# Patient Record
Sex: Female | Born: 1975 | Race: Asian | Hispanic: No | Marital: Married | Smoking: Never smoker
Health system: Southern US, Community
[De-identification: ages and names within clinical notes are randomized; demographics above are authoritative.]

## PROBLEM LIST (undated history)

## (undated) DIAGNOSIS — R519 Headache, unspecified: Secondary | ICD-10-CM

## (undated) DIAGNOSIS — K429 Umbilical hernia without obstruction or gangrene: Secondary | ICD-10-CM

## (undated) DIAGNOSIS — J189 Pneumonia, unspecified organism: Secondary | ICD-10-CM

## (undated) DIAGNOSIS — O09529 Supervision of elderly multigravida, unspecified trimester: Secondary | ICD-10-CM

## (undated) HISTORY — DX: Headache, unspecified: R51.9

## (undated) HISTORY — DX: Pneumonia, unspecified organism: J18.9

## (undated) HISTORY — DX: Supervision of elderly multigravida, unspecified trimester: O09.529

---

## 2017-03-25 ENCOUNTER — Encounter (INDEPENDENT_AMBULATORY_CARE_PROVIDER_SITE_OTHER): Payer: Self-pay | Admitting: Obstetrics and Gynecology

## 2017-03-25 ENCOUNTER — Ambulatory Visit (INDEPENDENT_AMBULATORY_CARE_PROVIDER_SITE_OTHER): Payer: No Typology Code available for payment source | Admitting: Obstetrics and Gynecology

## 2017-03-25 VITALS — BP 117/76 | HR 91 | Ht 63.0 in | Wt 132.6 lb

## 2017-03-25 DIAGNOSIS — Z3201 Encounter for pregnancy test, result positive: Secondary | ICD-10-CM

## 2017-03-25 LAB — CBC
Absolute NRBC: 0 10*3/uL
Hematocrit: 38 % (ref 37.0–47.0)
Hgb: 12.5 g/dL (ref 12.0–16.0)
MCH: 29.9 pg (ref 28.0–32.0)
MCHC: 32.9 g/dL (ref 32.0–36.0)
MCV: 90.9 fL (ref 80.0–100.0)
MPV: 10.3 fL (ref 9.4–12.3)
Nucleated RBC: 0 /100 WBC (ref 0.0–1.0)
Platelets: 283 10*3/uL (ref 140–400)
RBC: 4.18 10*6/uL — ABNORMAL LOW (ref 4.20–5.40)
RDW: 14 % (ref 12–15)
WBC: 9.83 10*3/uL (ref 3.50–10.80)

## 2017-03-25 LAB — GONOCOCCUS CULTURE
Chlamydia trachomatis Culture: NEGATIVE
Culture Gonorrhoeae: NEGATIVE

## 2017-03-25 LAB — ANTIBODY SCREEN: AB Screen Gel: NEGATIVE

## 2017-03-25 LAB — HEMOGLOBIN A1C
Average Estimated Glucose: 102.5 mg/dL
Hemoglobin A1C: 5.2 % (ref 4.6–5.9)

## 2017-03-25 LAB — ABO/RH: ABO Rh: O POS

## 2017-03-25 LAB — RUBELLA ANTIBODY, IGG: Rubella AB, IgG: IMMUNE

## 2017-03-25 LAB — TSH: TSH: 0.82 u[IU]/mL (ref 0.35–4.94)

## 2017-03-25 NOTE — Progress Notes (Signed)
CC:  Here for confirmation of pregnancy    HPi:  41yo G1P0 Patient's last menstrual period was 12/17/2016 (exact date)., regular cycles, certain date.  Some pelvic pain, mild intensity, crampy.  No vaginal bleeding.  Denies nausea or vomiting    ROS:  Pt denies chest pain, migraine headaches or dizziness, cough or shortness of breath, breast lump/pain/tenderness, constipation, diarrhea, fever, rash, urinary incontinence or dysuria.   Denies joint pain, anxiety or depression.  Denies swollen lymph nodes.  No problems with dry eyes.      Some seasonal allergies (sore throat and nasal congestion)    History reviewed. No pertinent past medical history.      Current Outpatient Prescriptions:   .  Prenatal Vit-Fe Fumarate-FA (PRENATAL PO), Take by mouth., Disp: , Rfl:     No Known Allergies    Family History   Problem Relation Age of Onset   . Heart disease Father    . Hypertension Mother    . Breast cancer Neg Hx    . Ovarian cancer Neg Hx    . Colon cancer Neg Hx      BP 117/76   Pulse 91   Ht 5\' 3"  (1.6 m)   Wt 132 lb 9.6 oz (60.1 kg)   LMP 12/17/2016 (Exact Date)   BMI 23.49 kg/m    Gen:  INAD, well developed, well nourished  Psych:  AAO x 3, mood congruent with affect  Thyroid:  Nontender, no nodules, within normal limits  Lungs:  CTA bilat no wheezes, rales, rhonchi.  Normal respiratory effort.   CV:  RRR no murmurs/rubs/gallops  Skin:  No rash, no lesions  Abd:  S/Nondistended/no mass/nontender to palpation  Pelvic:  NEFG                 Limited ob sono done:  SIUP, active fetal movement, + fetal cardiac activity in 150s, normal appearing amniotic fluid, posterior placenta  Femur length = on average 1.37 cm = [redacted]w[redacted]d    A/SIUP @ 14w0 days by certain lmp and confirmed by today's femur length.     Pregnancy test positive confirmed     Genetic counseling    P/EDD= 09/23/17 by lmp and today's sono     Genetic Counseling done:      Aneuploidy risk of 1:150  discussed.  Options for invasive diagnostic testing                        versus screening for aneuploidy reviewed.               We discussed detection rate of Down syndrome with cffDNA of >99%, risks of               this test to include confounding test results should placental mosaicism or occult  maternal malignancy exist, as well as inability to run test if fetal fraction is low.   We discussed 20% risk of aneuploidy if fetal fraction is low and that invasive  testing likely to be recommended in that case.   Reviewed that fetal gender can              be determined with this test.  We also discussed that this test does not allow for  assessment of risk for neural tube defects in the fetus (spina bifida).         First trimester screen counseling done. We discussed its approximate detection  rate of Down syndrome  of about 85% and false positive rate of about 5%.  We  discussed that it also has the benefit of ruling out anenchephaly early in  pregnancy and potential to aid early detection of congenital heart defects.  We   discussed that a numerical risk assessment for the fetus having Down  syndrome, Trisomy 58, and Trisomy 13 will be provided.    We also discussed  time dependency of gestational age with the most optimal time for screening to  be performed at 10 weeks.  If the patient presents to the [facility] for the blood work portion of the test at [redacted] weeks EGA and returns for the  ultrasound portion of the test, the patient will be able to receive her numerical  risk assessment on completion of the ultrasound portion.      We discussed Quad screen testing at 15-22 weeks for spina bifida screening,  and risk assessment for Down Syndrome, with a detection rate of about 75%  and false positive rate of about 5%.              Pt voices understanding and desires aneuploidy screening in the form of cell free                    fetal DNA.        Global Carrier Screening for autosomal recessive disorder was performed.  We  reviewed that this tests screens  for the patient being a carrier of about 175  conditions.  We discussed that if patient is found to be a carrier for an autosomal  recessive disorder, then we recommend screening her partner, and if both are  found to be carriers for the same disorder, there is a 25% chance their offspring  could be affected with the condition.  We discussed that the chance the patient  will found to be a carrier of at least one condition is about 1 in 5, but the chance  that both her and her partner are carriers of the same condition is <1%. We  reviewed that the conditions being screened for are conditions that can  either  lead to a shortened lifespan, cause intellectual disability, whose  outcome may  be improved with early detection, or that has limited or no  treatment options.     Patient voices understanding and desires testing.         Counsyl cffDNA and carrier screening ordered.              CBC, ab screen, BTG, and screening tests for thyroid disease and hypothyroidism ordered                Follow up asap for IPV     Spent 30 minutes face to face with patient, >50% spent counseling and coordinating care.   Oriented patient to practice, rotating call schedule, schedule of visits, delivery and emergency care at [facility].

## 2017-03-25 NOTE — Patient Instructions (Signed)
Diet rich in vegetables and fruit discussed.  Limit increased caloric intake to an extra 300 kcal per day above your usual daily caloric intake.  Recommended weight gain for entire pregancy is 25-35 lbs with a 10 lb weight gain by 20 weeks of pregnancy. You will gain most of your weight at the end of pregnancy.  If you're body mass index (BMI) is >30, it is recommended you do not gain more than 15 lbs during the pregnancy.   Avoid unpasteurized foods.  Limit seafood intake to no greater than 2 servings per week avoiding fish high in Mercury (monkfish, tilefish, shark, king mackerel, swordfish).  Lunchmeat and hot dogs should be served hot.  Encourage at least 30 minutes of walking daily.  If you have been participating in sports/physical activity, you may continue at your usual level.  Stay well hydrated and avoid overheating.      Breastfeeding recommended through first year of the child's life and both short  term and long term benefits for their infant  to include reduction in gastrointestinal and respiratory disease as well as acute illness, reduction in maternal and childhood obesity, potential reduction in Type I diabetes and childhood cancers, and improved maternal-infant bonding, as well as  reduction in future risk of maternal breast cancer.    Recommended lactation support classes provided at [facility] prior to delivery and reference book of "The Nursing Mother's Companion" by Kathleen Huggins also recommended.

## 2017-03-27 ENCOUNTER — Encounter (INDEPENDENT_AMBULATORY_CARE_PROVIDER_SITE_OTHER): Payer: Self-pay | Admitting: Obstetrics and Gynecology

## 2017-03-28 ENCOUNTER — Encounter (INDEPENDENT_AMBULATORY_CARE_PROVIDER_SITE_OTHER): Payer: Self-pay | Admitting: Obstetrics and Gynecology

## 2017-04-02 ENCOUNTER — Encounter (INDEPENDENT_AMBULATORY_CARE_PROVIDER_SITE_OTHER): Payer: Self-pay | Admitting: Obstetrics and Gynecology

## 2017-04-02 ENCOUNTER — Encounter (INDEPENDENT_AMBULATORY_CARE_PROVIDER_SITE_OTHER): Payer: Self-pay | Admitting: Obstetrics & Gynecology

## 2017-04-02 ENCOUNTER — Ambulatory Visit (INDEPENDENT_AMBULATORY_CARE_PROVIDER_SITE_OTHER): Payer: No Typology Code available for payment source | Admitting: Obstetrics & Gynecology

## 2017-04-02 VITALS — BP 107/67 | HR 79 | Wt 132.6 lb

## 2017-04-02 DIAGNOSIS — Z3490 Encounter for supervision of normal pregnancy, unspecified, unspecified trimester: Secondary | ICD-10-CM | POA: Insufficient documentation

## 2017-04-02 DIAGNOSIS — O09512 Supervision of elderly primigravida, second trimester: Secondary | ICD-10-CM

## 2017-04-02 LAB — HEMOLYSIS INDEX: Hemolysis Index: 5 (ref 0–18)

## 2017-04-02 NOTE — Progress Notes (Signed)
Subjective:     Kim Booker,Kim Booker is being seen today for her first obstetrical visit.  She is at [redacted]w[redacted]d. Her obstetrical history is significant for: AMA.   Relationship with FOB: spouse, living together - Deliah Boston. Patient does intend to breast feed.   Denies nausea.  Denies cramping or bleeding.     ROS:  General: no unintentional weight changes, fevers, chills   Cardiovascular: negative  Pulmonary: negative  Gastrointestinal: negative  Genital: no lesions, abnormal vaginal discharge, pelvic pain, itching, dryness, irritation, abnormal vaginal bleeding  Urinary: no dysuria, hematuria, incontinence  Neurological: negative      Travel to [place] affected area in last 6 months for patient or partner: no  Cats at home: no   Personal or partner history of genital HSV: no      Menstrual History:  OB History     Gravida Para Term Preterm AB Living    1              SAB TAB Ectopic Multiple Live Births                        Patient's last menstrual period was 12/17/2016 (exact date).       Past Medical, Surgical, Family, Social history:     The following portions of the patient's history were reviewed and updated as appropriate: allergies, current medications, past family history, past medical history, past social history, past surgical history and problem list.         Objective:        Vitals:    04/02/17 1141   BP: 107/67   Pulse: 79     GEN: Well developed, alert and oriented x 3  SKIN: (exposed areas only)    Rashes: No  Lesions: No Ulcers: No  NECK:  supple, no significant adenopathy, thyroid is normal in size without nodules or tenderness  BREASTS: symmetric, normal axillary lymph nodes bilaterally   RIGHT: no masses, tenderness, skin changes, or nipple discharge   LEFT: no masses, tenderness, skin changes, or nipple discharge  LYMPHATIC: no significant cervical, supraclavicular, axillary or inguinal adenopathy  RESPIRATORY: lungs clear to auscultation bilaterally, no rales, rhonchi,  crackles  CARDIOVASCULAR: Regular rate, normal rhythm  ABDOMEN: Non-tender, no hernia or masses  GENITOURINARY EXAM:   EXT GENITALIA: normal external genitalia, no erythema, no lesions   PERINEUM: normal, no lesions, non tender   URETHRA / URETHRAL MEATUS: normal    VAGINA: normal appearing vagina with normal mucosa and discharge, no lesions, adequate support               CERVIX: normal appearing cervix without discharge or lesions   UTERUS: gravid, mobile, non-tender       Left ADNEXA: No masses, nodularity, tenderness   Right ADNEXA: No masses, nodularity, tenderness   RECTAL: deferred        Fetus-150bpm    Exam WNL, See Prenatal Physical.    Assessment:     40yo G1, IUP at [redacted]w[redacted]d by LMP c/w 14 [place] who presents for initial OB visit.    Pregnancy complicated by advanced maternal age  Plan:     Orders Placed This Encounter   Procedures   . Urine culture   . Pap Smear, Thin Prep Method w/ HR HPV   . RPR (Reflex to Titer and Confirmation)   . Rubella antibody, IgG   . Varicella Zoster Antibody, IgG   . HIV Ag/Ab 4th generation   .  Hepatitis C (HCV) antibody, Total   . Hepatitis B (HBV) Surface Antigen   . Hemoglobinopathy Profile   . Cystic Fibrosis Screen   . C. Trachomatis/N. Gonorrhoeae RNA, TMA   . CBC and differential   . Hemolysis index   . MFM US OB Detail Fetal Anatomy Single Or First Gestation         Oriented patient to practice and delivery and emergency care at [facility].  Discussed available screening for autosomal recessive diseases and chromosomal abnormalities (specifically trisomies 21, 13, 18), neural tube defects, and the level II sono.   Reviewed pregnancy education material given on diet, exercise, safe OCT meds, environmental exposures, recommended vaccinations and prenatal vitamins.    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    Return in about 4 weeks (around 04/30/2017) for ROB.

## 2017-04-03 LAB — CBC AND DIFFERENTIAL
Absolute NRBC: 0 10*3/uL
Basophils Absolute Automated: 0.05 10*3/uL (ref 0.00–0.20)
Basophils Automated: 0.6 %
Eosinophils Absolute Automated: 0.12 10*3/uL (ref 0.00–0.70)
Eosinophils Automated: 1.4 %
Hematocrit: 38.6 % (ref 37.0–47.0)
Hgb: 12.3 g/dL (ref 12.0–16.0)
Immature Granulocytes Absolute: 0.04 10*3/uL
Immature Granulocytes: 0.5 %
Lymphocytes Absolute Automated: 1.71 10*3/uL (ref 0.50–4.40)
Lymphocytes Automated: 19.3 %
MCH: 29.6 pg (ref 28.0–32.0)
MCHC: 31.9 g/dL — ABNORMAL LOW (ref 32.0–36.0)
MCV: 92.8 fL (ref 80.0–100.0)
MPV: 10.3 fL (ref 9.4–12.3)
Monocytes Absolute Automated: 0.62 10*3/uL (ref 0.00–1.20)
Monocytes: 7 %
Neutrophils Absolute: 6.3 10*3/uL (ref 1.80–8.10)
Neutrophils: 71.2 %
Nucleated RBC: 0 /100 WBC (ref 0.0–1.0)
Platelets: 279 10*3/uL (ref 140–400)
RBC: 4.16 10*6/uL — ABNORMAL LOW (ref 4.20–5.40)
RDW: 14 % (ref 12–15)
WBC: 8.84 10*3/uL (ref 3.50–10.80)

## 2017-04-03 LAB — RPR (REFLEX TO TITER AND CONFIRMATION): RPR: NONREACTIVE

## 2017-04-03 LAB — HEPATITIS C ANTIBODY: Hepatitis C, AB: NONREACTIVE

## 2017-04-03 LAB — RUBELLA ANTIBODY, IGG: Rubella AB, IgG: 2.76

## 2017-04-03 LAB — HIV AG/AB 4TH GENERATION: HIV Ag/Ab, 4th Generation: NONREACTIVE

## 2017-04-03 LAB — VARICELLA ZOSTER ANTIBODY, IGG: Varicella, IgG: 2.47

## 2017-04-03 LAB — HEPATITIS B SURFACE ANTIGEN W/ REFLEX TO CONFIRMATION: Hepatitis B Surface Antigen: NONREACTIVE

## 2017-04-04 ENCOUNTER — Encounter (INDEPENDENT_AMBULATORY_CARE_PROVIDER_SITE_OTHER): Payer: Self-pay | Admitting: Obstetrics and Gynecology

## 2017-04-04 LAB — HEMOGLOBINOPATHY PROFILE
Hemoglobin A2: 2.6 % (ref 1.8–3.5)
Hemoglobin A: 97.4 % (ref >96.0)
Hemoglobin F: 0 % (ref <2.0)
Plasma Free Hemoglobin: 1.3 mg/dL (ref <8.4)

## 2017-04-04 LAB — C. TRACHOMATIS/N. GONORRHOEAE RNA, TMA
Chlamydia trachomatis RNA, TMA: NOT DETECTED
N. gonorrhoeae RNA, TMA: NOT DETECTED

## 2017-04-08 ENCOUNTER — Telehealth (INDEPENDENT_AMBULATORY_CARE_PROVIDER_SITE_OTHER): Payer: Self-pay

## 2017-04-08 ENCOUNTER — Encounter (INDEPENDENT_AMBULATORY_CARE_PROVIDER_SITE_OTHER): Payer: Self-pay

## 2017-04-08 LAB — CYSTIC FIBROSIS SCREEN

## 2017-04-08 NOTE — Telephone Encounter (Signed)
Per Dr. 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 Please recommend she and her partner set up phone genetics counseling with [facility] and consider testing for her partner.     Called patient and was unable to leave a VM message, as voice mail has not been set up yet.  Will send MyChart message for patient to call the office to review [facility] carrier screening results and recommendations.

## 2017-04-09 ENCOUNTER — Encounter (INDEPENDENT_AMBULATORY_CARE_PROVIDER_SITE_OTHER): Payer: Self-pay

## 2017-04-09 ENCOUNTER — Telehealth (INDEPENDENT_AMBULATORY_CARE_PROVIDER_SITE_OTHER): Payer: Self-pay

## 2017-04-09 LAB — PAP SMEAR, THIN PREP WITH HR HPV: HPV DNA, high risk: NOT DETECTED

## 2017-04-09 NOTE — Telephone Encounter (Signed)
If pt call back asking about her baby gender, please inform her she is having a GIRL!

## 2017-04-09 NOTE — Telephone Encounter (Signed)
Patient returned my call to the office.  I spoke with the patient and her husband Casimiro Needle.  Patient's spouse had carrier testing done on 04/02/17.

## 2017-04-12 ENCOUNTER — Encounter (INDEPENDENT_AMBULATORY_CARE_PROVIDER_SITE_OTHER): Payer: Self-pay | Admitting: Obstetrics & Gynecology

## 2017-04-16 ENCOUNTER — Encounter (INDEPENDENT_AMBULATORY_CARE_PROVIDER_SITE_OTHER): Payer: Self-pay | Admitting: Obstetrics and Gynecology

## 2017-04-16 ENCOUNTER — Encounter (INDEPENDENT_AMBULATORY_CARE_PROVIDER_SITE_OTHER): Payer: Self-pay | Admitting: Obstetrics & Gynecology

## 2017-04-26 ENCOUNTER — Encounter (INDEPENDENT_AMBULATORY_CARE_PROVIDER_SITE_OTHER): Payer: Self-pay | Admitting: Obstetrics and Gynecology

## 2017-04-30 ENCOUNTER — Ambulatory Visit (INDEPENDENT_AMBULATORY_CARE_PROVIDER_SITE_OTHER): Payer: No Typology Code available for payment source | Admitting: Obstetrics and Gynecology

## 2017-04-30 VITALS — BP 111/72 | HR 73 | Wt 136.2 lb

## 2017-04-30 DIAGNOSIS — O09512 Supervision of elderly primigravida, second trimester: Secondary | ICD-10-CM

## 2017-04-30 DIAGNOSIS — Z3A19 19 weeks gestation of pregnancy: Secondary | ICD-10-CM

## 2017-04-30 NOTE — Progress Notes (Signed)
G1 P0, 19 weeks 1 day.  Complains only of some lower back discomfort, mild.    Back exercises literature reviewed.  Chiropractic care not recommended at this time.    Size equals dates.    Msafp counseling done and ordered today  Prenatal labs reviewed.  Cell free fetal DNA within normal limits and shared with patient she is having a female fetus  Follow-up in 4 weeks.  Complete OB sono at 20 weeks, scheduled

## 2017-05-04 LAB — ALPHA FETOPROTEIN, MATERNAL
Gestational Age:: 19.1
MSAFP MoM: 1.18
MSAFP: 64.6 ng/mL
Number of Fetuses:: 1
Repeat Specimen:: NEGATIVE
Risk for ONTD: 1:2300 {titer}
WGHT: 136

## 2017-05-07 ENCOUNTER — Ambulatory Visit
Admission: RE | Admit: 2017-05-07 | Discharge: 2017-05-07 | Disposition: A | Discharge status: Home / Self Care | Payer: No Typology Code available for payment source | Source: Ambulatory Visit | Attending: Obstetrics & Gynecology | Admitting: Obstetrics & Gynecology

## 2017-05-07 DIAGNOSIS — O26892 Other specified pregnancy related conditions, second trimester: Secondary | ICD-10-CM

## 2017-05-07 DIAGNOSIS — Z3A20 20 weeks gestation of pregnancy: Secondary | ICD-10-CM | POA: Insufficient documentation

## 2017-05-07 DIAGNOSIS — O321XX0 Maternal care for breech presentation, not applicable or unspecified: Secondary | ICD-10-CM

## 2017-05-07 DIAGNOSIS — O09512 Supervision of elderly primigravida, second trimester: Secondary | ICD-10-CM | POA: Insufficient documentation

## 2017-05-07 DIAGNOSIS — O09513 Supervision of elderly primigravida, third trimester: Secondary | ICD-10-CM

## 2017-05-07 DIAGNOSIS — O359XX0 Maternal care for (suspected) fetal abnormality and damage, unspecified, not applicable or unspecified: Secondary | ICD-10-CM

## 2017-05-28 ENCOUNTER — Ambulatory Visit (INDEPENDENT_AMBULATORY_CARE_PROVIDER_SITE_OTHER): Payer: No Typology Code available for payment source | Admitting: Obstetrics & Gynecology

## 2017-05-31 ENCOUNTER — Ambulatory Visit (INDEPENDENT_AMBULATORY_CARE_PROVIDER_SITE_OTHER): Payer: No Typology Code available for payment source | Admitting: Obstetrics and Gynecology

## 2017-05-31 ENCOUNTER — Encounter (INDEPENDENT_AMBULATORY_CARE_PROVIDER_SITE_OTHER): Payer: Self-pay | Admitting: Obstetrics and Gynecology

## 2017-05-31 VITALS — BP 104/69 | HR 86 | Wt 141.0 lb

## 2017-05-31 DIAGNOSIS — Z3A23 23 weeks gestation of pregnancy: Secondary | ICD-10-CM

## 2017-05-31 DIAGNOSIS — O09523 Supervision of elderly multigravida, third trimester: Secondary | ICD-10-CM

## 2017-05-31 NOTE — Patient Instructions (Signed)
Call for contractions > 4-6 times in an hour that are not relieved after resting and drinking water for an hour.          Call if vaginal bleeding occurs, or if you think your water broke.    Call for severe headaches, flashing lights in your vision, upper abdominal pain, nausea or vomiting.      Follow up for your next routine ob visit in 4 week(s), 28 week labs next visit

## 2017-05-31 NOTE — Progress Notes (Signed)
41yo G1P0, [redacted]w[redacted]d  Intermittent headaches, some constipation.  PO hydration encouraged, measures for relief of constipation reviewed  Reassurance provided about trial for vaginal delivery, pt wondered if a primary cesarean section is indicated based on her maternal age.   Answered questions about orthodonture in pregnancy  Sleep position discussed, leftward pelvic tilt advised  Reviewed normal MSAFP results and normal complete ob sono  PTL precautions reviewed, f/u in 4 weeks, 28 week labs next visit

## 2017-06-24 ENCOUNTER — Ambulatory Visit (INDEPENDENT_AMBULATORY_CARE_PROVIDER_SITE_OTHER): Payer: No Typology Code available for payment source | Admitting: Obstetrics and Gynecology

## 2017-07-05 ENCOUNTER — Ambulatory Visit (INDEPENDENT_AMBULATORY_CARE_PROVIDER_SITE_OTHER): Payer: No Typology Code available for payment source | Admitting: Obstetrics and Gynecology

## 2017-07-05 ENCOUNTER — Other Ambulatory Visit (FREE_STANDING_LABORATORY_FACILITY): Payer: No Typology Code available for payment source

## 2017-07-05 ENCOUNTER — Encounter (INDEPENDENT_AMBULATORY_CARE_PROVIDER_SITE_OTHER): Payer: Self-pay | Admitting: Obstetrics and Gynecology

## 2017-07-05 VITALS — BP 108/75 | HR 84 | Wt 145.8 lb

## 2017-07-05 DIAGNOSIS — O09523 Supervision of elderly multigravida, third trimester: Secondary | ICD-10-CM

## 2017-07-05 DIAGNOSIS — Z3A23 23 weeks gestation of pregnancy: Secondary | ICD-10-CM

## 2017-07-05 DIAGNOSIS — Z23 Encounter for immunization: Secondary | ICD-10-CM

## 2017-07-05 DIAGNOSIS — Z3403 Encounter for supervision of normal first pregnancy, third trimester: Secondary | ICD-10-CM

## 2017-07-05 DIAGNOSIS — O09513 Supervision of elderly primigravida, third trimester: Secondary | ICD-10-CM

## 2017-07-05 DIAGNOSIS — Z3A28 28 weeks gestation of pregnancy: Secondary | ICD-10-CM

## 2017-07-05 LAB — CBC
Absolute NRBC: 0 10*3/uL
Hematocrit: 33.1 % — ABNORMAL LOW (ref 37.0–47.0)
Hgb: 10.9 g/dL — ABNORMAL LOW (ref 12.0–16.0)
MCH: 31 pg (ref 28.0–32.0)
MCHC: 32.9 g/dL (ref 32.0–36.0)
MCV: 94 fL (ref 80.0–100.0)
MPV: 10.4 fL (ref 9.4–12.3)
Nucleated RBC: 0 /100 WBC (ref 0.0–1.0)
Platelets: 255 10*3/uL (ref 140–400)
RBC: 3.52 10*6/uL — ABNORMAL LOW (ref 4.20–5.40)
RDW: 13 % (ref 12–15)
WBC: 10.23 10*3/uL (ref 3.50–10.80)

## 2017-07-05 LAB — ABO/RH: ABO Rh: O POS

## 2017-07-05 LAB — GLUCOSE CHALLENGE: Glucose Challenge: 135 mg/dL

## 2017-07-05 LAB — ANTIBODY SCREEN: AB Screen Gel: NEGATIVE

## 2017-07-05 NOTE — Progress Notes (Signed)
41yo G1P0, [redacted]w[redacted]d  No complaints, +AFM.  No abnormal discharge per vagina, no abnormal bleeding  No contractions  Reviewed weight gain, TWG anticipated to be within recommended guidelines  S=D  28 week labs today  PTL/FKC precautions reviewed  TdaP counseling performed, ordered today.   F/U in 2 weeks

## 2017-07-05 NOTE — Patient Instructions (Signed)
Call for contractions > 4-6 times in an hour that are not relieved after resting and drinking water for an hour.      Call if unable to get adequate fetal kick counts:  Pick the time of the day when your baby is the most active.  You should be able to get 10 movements in that hour.  Any movement counts (kick, punch, roll).  The baby will not be that active all day long only during their usual time of increased activity.  If unable to get your fetal kick counts, drink cold water or something sweet and repeat.  If still unable to get your kick counts, call our office even if it is after normal business hours.      Call if vaginal bleeding occurs, or if you think your water broke.    Call for severe headaches, flashing lights in your vision, upper abdominal pain, nausea or vomiting.      Follow up for your next routine ob visit in 2 week(s)

## 2017-07-06 ENCOUNTER — Other Ambulatory Visit (INDEPENDENT_AMBULATORY_CARE_PROVIDER_SITE_OTHER): Payer: Self-pay | Admitting: Obstetrics and Gynecology

## 2017-07-06 DIAGNOSIS — O99810 Abnormal glucose complicating pregnancy: Secondary | ICD-10-CM

## 2017-07-11 ENCOUNTER — Ambulatory Visit
Admission: RE | Admit: 2017-07-11 | Discharge: 2017-07-11 | Disposition: A | Discharge status: Home / Self Care | Payer: No Typology Code available for payment source | Source: Ambulatory Visit | Attending: Obstetrics and Gynecology | Admitting: Obstetrics and Gynecology

## 2017-07-11 DIAGNOSIS — O09513 Supervision of elderly primigravida, third trimester: Secondary | ICD-10-CM | POA: Insufficient documentation

## 2017-07-11 DIAGNOSIS — O26893 Other specified pregnancy related conditions, third trimester: Secondary | ICD-10-CM

## 2017-07-11 DIAGNOSIS — Z3A29 29 weeks gestation of pregnancy: Secondary | ICD-10-CM

## 2017-07-11 DIAGNOSIS — Z3A28 28 weeks gestation of pregnancy: Secondary | ICD-10-CM

## 2017-07-11 DIAGNOSIS — O322XX0 Maternal care for transverse and oblique lie, not applicable or unspecified: Secondary | ICD-10-CM

## 2017-07-11 DIAGNOSIS — O99810 Abnormal glucose complicating pregnancy: Secondary | ICD-10-CM | POA: Insufficient documentation

## 2017-07-12 ENCOUNTER — Other Ambulatory Visit (FREE_STANDING_LABORATORY_FACILITY): Payer: No Typology Code available for payment source

## 2017-07-12 DIAGNOSIS — O99810 Abnormal glucose complicating pregnancy: Secondary | ICD-10-CM

## 2017-07-12 LAB — GLUCOSE TOLERANCE, 3 HOURS
GTT - 1 hr Specimen: 152 mg/dL
GTT - 2 hr Specimen: 146 mg/dL
GTT - 3 hr Specimen: 108 mg/dL
GTT - Fasting Specimen: 68 mg/dL — ABNORMAL LOW (ref 70–100)

## 2017-07-16 ENCOUNTER — Encounter (INDEPENDENT_AMBULATORY_CARE_PROVIDER_SITE_OTHER): Payer: Self-pay | Admitting: Obstetrics and Gynecology

## 2017-07-16 ENCOUNTER — Ambulatory Visit (INDEPENDENT_AMBULATORY_CARE_PROVIDER_SITE_OTHER): Payer: No Typology Code available for payment source | Admitting: Obstetrics and Gynecology

## 2017-07-16 VITALS — BP 113/74 | HR 90 | Wt 148.6 lb

## 2017-07-16 DIAGNOSIS — Z3A30 30 weeks gestation of pregnancy: Secondary | ICD-10-CM

## 2017-07-16 DIAGNOSIS — O09513 Supervision of elderly primigravida, third trimester: Secondary | ICD-10-CM

## 2017-07-16 NOTE — Patient Instructions (Signed)
Call for contractions > 4-6 times in an hour that are not relieved after resting and drinking water for an hour.      Call if unable to get adequate fetal kick counts:  Pick the time of the day when your baby is the most active.  You should be able to get 10 movements in that hour.  Any movement counts (kick, punch, roll).  The baby will not be that active all day long only during their usual time of increased activity.  If unable to get your fetal kick counts, drink cold water or something sweet and repeat.  If still unable to get your kick counts, call our office even if it is after normal business hours.      Call if vaginal bleeding occurs, or if you think your water broke.    Call for severe headaches, flashing lights in your vision, upper abdominal pain, nausea or vomiting.      Follow up for your next routine ob visit in 2 week(s)

## 2017-07-16 NOTE — Progress Notes (Signed)
41yo G1P0, [redacted]w[redacted]d  No complaints  +AFM.  No contractions.  Passed 3 hr GTT  S=D  PTL/FKC precautions reviewed  Answered questions regarding expectations of L&D, pediatrician/FP referral  F/U 2 weeks

## 2017-07-31 ENCOUNTER — Ambulatory Visit (INDEPENDENT_AMBULATORY_CARE_PROVIDER_SITE_OTHER): Payer: Self-pay | Admitting: Obstetrics and Gynecology

## 2017-08-01 ENCOUNTER — Encounter (INDEPENDENT_AMBULATORY_CARE_PROVIDER_SITE_OTHER): Payer: Self-pay | Admitting: Obstetrics & Gynecology

## 2017-08-01 ENCOUNTER — Ambulatory Visit (INDEPENDENT_AMBULATORY_CARE_PROVIDER_SITE_OTHER): Payer: No Typology Code available for payment source | Admitting: Obstetrics & Gynecology

## 2017-08-01 VITALS — BP 100/66 | HR 99 | Wt 150.2 lb

## 2017-08-01 DIAGNOSIS — O09513 Supervision of elderly primigravida, third trimester: Secondary | ICD-10-CM

## 2017-08-02 NOTE — Progress Notes (Signed)
IUP at 32.3 weeks, no complaints except edema of hands and feet  Discussed increase water and decrease salt  Good FM. Denies cramping or bleeding or ctxs  S=D, received TDAP  F/u in 2 weeks

## 2017-08-06 ENCOUNTER — Ambulatory Visit: Payer: Self-pay

## 2017-08-12 ENCOUNTER — Ambulatory Visit (INDEPENDENT_AMBULATORY_CARE_PROVIDER_SITE_OTHER): Payer: No Typology Code available for payment source | Admitting: Obstetrics and Gynecology

## 2017-08-12 ENCOUNTER — Encounter (INDEPENDENT_AMBULATORY_CARE_PROVIDER_SITE_OTHER): Payer: Self-pay | Admitting: Obstetrics and Gynecology

## 2017-08-12 VITALS — BP 99/62 | HR 96 | Wt 149.6 lb

## 2017-08-12 DIAGNOSIS — Z3A34 34 weeks gestation of pregnancy: Secondary | ICD-10-CM

## 2017-08-12 DIAGNOSIS — Z23 Encounter for immunization: Secondary | ICD-10-CM

## 2017-08-12 DIAGNOSIS — Z3403 Encounter for supervision of normal first pregnancy, third trimester: Secondary | ICD-10-CM

## 2017-08-12 NOTE — Patient Instructions (Signed)
Call for contractions > 4-6 times in an hour that are not relieved after resting and drinking water for an hour.      Call if unable to get adequate fetal kick counts:  Pick the time of the day when your baby is the most active.  You should be able to get 10 movements in that hour.  Any movement counts (kick, punch, roll).  The baby will not be that active all day long only during their usual time of increased activity.  If unable to get your fetal kick counts, drink cold water or something sweet and repeat.  If still unable to get your kick counts, call our office even if it is after normal business hours.      Call if vaginal bleeding occurs, or if you think your water broke.    Call for severe headaches, flashing lights in your vision, upper abdominal pain, nausea or vomiting.      Follow up for your next routine ob visit in 2 week(s)    You receive your flu vaccine today

## 2017-08-12 NOTE — Progress Notes (Signed)
Pt complains of bilateral hand numbness in the morning consistent with carpal tunnel syndrome, advised to use wrist splints at night  +AFM via [facility]  No leaking of fluid, abnormal discharge or bleeding per vagina  No contractions  S=D    PTL/FKC/Preeclampsia precautions  F/U in 2 weeks    TdaP: has been given already  Flu vaccine counseling done and given today  Labs: GBS and sono next visit

## 2017-08-19 LAB — GROUP B STREP TRANSCRIBED: GBS Transcribed: NEGATIVE

## 2017-08-22 ENCOUNTER — Telehealth: Payer: Self-pay

## 2017-08-22 NOTE — Telephone Encounter (Signed)
Voice message left for pt to return call to complete pre-admission questions.

## 2017-08-28 ENCOUNTER — Ambulatory Visit (INDEPENDENT_AMBULATORY_CARE_PROVIDER_SITE_OTHER): Payer: No Typology Code available for payment source | Admitting: Obstetrics & Gynecology

## 2017-08-28 ENCOUNTER — Encounter (INDEPENDENT_AMBULATORY_CARE_PROVIDER_SITE_OTHER): Payer: Self-pay | Admitting: Obstetrics & Gynecology

## 2017-08-28 VITALS — BP 107/76 | HR 99 | Wt 154.6 lb

## 2017-08-28 DIAGNOSIS — Z3403 Encounter for supervision of normal first pregnancy, third trimester: Secondary | ICD-10-CM

## 2017-08-28 NOTE — Progress Notes (Signed)
[redacted]w[redacted]d  Reports good fetal movement.  Denies contractions, vaginal bleeding, or leaking fluid.   Reviewed preeclampsia, labor and FKC precautions.   All questions answered.   GBS done today.  Cephalic presentation confirmed by ultrasound today.    Patient needs NST done today.  2.  Late for NST be performed today.  Patient will return to office tomorrow morning at 8:15 to have NST done.    Note, patient states that she would like to deliver at the [facility].  Patient states that she has met with a midwife at [facility].  She states she has not transferred care yet.  Patient advised that if she would like to deliver at the natural birth center with a midwife care, then she would need to transfer her obstetrical care.

## 2017-08-29 ENCOUNTER — Ambulatory Visit (INDEPENDENT_AMBULATORY_CARE_PROVIDER_SITE_OTHER): Payer: No Typology Code available for payment source | Admitting: Obstetrics & Gynecology

## 2017-08-29 ENCOUNTER — Encounter (INDEPENDENT_AMBULATORY_CARE_PROVIDER_SITE_OTHER): Payer: Self-pay | Admitting: Obstetrics & Gynecology

## 2017-08-29 VITALS — BP 99/68 | HR 79 | Wt 151.8 lb

## 2017-08-29 DIAGNOSIS — O09513 Supervision of elderly primigravida, third trimester: Secondary | ICD-10-CM

## 2017-08-29 NOTE — Progress Notes (Signed)
Patient returns today for NST. No complaints. Good fetal movement. NST reactive today.

## 2017-09-02 ENCOUNTER — Encounter (INDEPENDENT_AMBULATORY_CARE_PROVIDER_SITE_OTHER): Payer: Self-pay | Admitting: Obstetrics & Gynecology

## 2017-09-02 ENCOUNTER — Telehealth: Payer: Self-pay

## 2017-09-02 NOTE — Telephone Encounter (Signed)
Will call at a later time to review preadmission question as per pt's request.

## 2017-09-03 ENCOUNTER — Telehealth: Payer: Self-pay

## 2017-09-03 NOTE — Telephone Encounter (Signed)
Voice message left requesting a return call to review pre-admissions questions.

## 2017-09-05 ENCOUNTER — Ambulatory Visit (INDEPENDENT_AMBULATORY_CARE_PROVIDER_SITE_OTHER): Payer: Self-pay | Admitting: Obstetrics & Gynecology

## 2017-09-12 ENCOUNTER — Ambulatory Visit (INDEPENDENT_AMBULATORY_CARE_PROVIDER_SITE_OTHER): Payer: No Typology Code available for payment source | Admitting: Obstetrics & Gynecology

## 2017-09-18 ENCOUNTER — Ambulatory Visit (INDEPENDENT_AMBULATORY_CARE_PROVIDER_SITE_OTHER): Payer: No Typology Code available for payment source | Admitting: Obstetrics & Gynecology

## 2017-09-23 ENCOUNTER — Ambulatory Visit (INDEPENDENT_AMBULATORY_CARE_PROVIDER_SITE_OTHER): Payer: No Typology Code available for payment source | Admitting: Obstetrics & Gynecology

## 2017-09-25 ENCOUNTER — Other Ambulatory Visit: Payer: Self-pay | Admitting: Advanced Practice Midwife

## 2017-09-25 DIAGNOSIS — O480 Post-term pregnancy: Secondary | ICD-10-CM

## 2017-09-27 ENCOUNTER — Ambulatory Visit: Payer: No Typology Code available for payment source

## 2017-09-29 ENCOUNTER — Inpatient Hospital Stay
Admission: RE | Admit: 2017-09-29 | Discharge: 2017-10-03 | DRG: 788 | Disposition: A | Discharge status: Home / Self Care | Payer: No Typology Code available for payment source | Source: Ambulatory Visit | Attending: Obstetrics & Gynecology | Admitting: Obstetrics & Gynecology

## 2017-09-29 DIAGNOSIS — O480 Post-term pregnancy: Secondary | ICD-10-CM | POA: Diagnosis present

## 2017-09-29 DIAGNOSIS — R339 Retention of urine, unspecified: Secondary | ICD-10-CM | POA: Diagnosis not present

## 2017-09-29 DIAGNOSIS — O4210 Premature rupture of membranes, onset of labor more than 24 hours following rupture, unspecified weeks of gestation: Principal | ICD-10-CM | POA: Diagnosis present

## 2017-09-29 DIAGNOSIS — O9952 Diseases of the respiratory system complicating childbirth: Secondary | ICD-10-CM | POA: Diagnosis present

## 2017-09-29 DIAGNOSIS — Z3A40 40 weeks gestation of pregnancy: Secondary | ICD-10-CM

## 2017-09-29 DIAGNOSIS — J45909 Unspecified asthma, uncomplicated: Secondary | ICD-10-CM | POA: Diagnosis present

## 2017-09-29 LAB — CBC AND DIFFERENTIAL
Absolute NRBC: 0 10*3/uL
Basophils Absolute Automated: 0.03 10*3/uL (ref 0.00–0.20)
Basophils Automated: 0.4 %
Eosinophils Absolute Automated: 0.06 10*3/uL (ref 0.00–0.70)
Eosinophils Automated: 0.8 %
Hematocrit: 35.2 % — ABNORMAL LOW (ref 37.0–47.0)
Hgb: 12 g/dL (ref 12.0–16.0)
Immature Granulocytes Absolute: 0.07 10*3/uL — ABNORMAL HIGH
Immature Granulocytes: 1 %
Lymphocytes Absolute Automated: 1.85 10*3/uL (ref 0.50–4.40)
Lymphocytes Automated: 26 %
MCH: 30.8 pg (ref 28.0–32.0)
MCHC: 34.1 g/dL (ref 32.0–36.0)
MCV: 90.5 fL (ref 80.0–100.0)
MPV: 11.5 fL (ref 9.4–12.3)
Monocytes Absolute Automated: 0.78 10*3/uL (ref 0.00–1.20)
Monocytes: 11 %
Neutrophils Absolute: 4.32 10*3/uL (ref 1.80–8.10)
Neutrophils: 60.8 %
Nucleated RBC: 0 /100 WBC (ref 0.0–1.0)
Platelets: 178 10*3/uL (ref 140–400)
RBC: 3.89 10*6/uL — ABNORMAL LOW (ref 4.20–5.40)
RDW: 15 % (ref 12–15)
WBC: 7.11 10*3/uL (ref 3.50–10.80)

## 2017-09-29 LAB — TYPE AND SCREEN
AB Screen Gel: NEGATIVE
ABO Rh: O POS

## 2017-09-29 MED ORDER — SODIUM CHLORIDE 0.9 % IJ SOLN
3.0000 mL | Freq: Three times a day (TID) | INTRAMUSCULAR | Status: DC
Start: 2017-09-29 — End: 2017-09-30
  Administered 2017-09-30: 16:00:00 3 mL via INTRAVENOUS

## 2017-09-29 MED ORDER — FENTANYL CITRATE (PF) 50 MCG/ML IJ SOLN (WRAP)
100.0000 ug | INTRAMUSCULAR | Status: DC | PRN
Start: 2017-09-29 — End: 2017-09-30
  Administered 2017-09-30 (×2): 50 ug via INTRAVENOUS
  Filled 2017-09-29: qty 2

## 2017-09-29 MED ORDER — TERBUTALINE SULFATE 1 MG/ML IJ SOLN
0.2500 mg | Freq: Once | INTRAMUSCULAR | Status: DC | PRN
Start: 2017-09-29 — End: 2017-09-30

## 2017-09-29 MED ORDER — ACETAMINOPHEN 650 MG RE SUPP
650.0000 mg | Freq: Four times a day (QID) | RECTAL | Status: DC | PRN
Start: 2017-09-29 — End: 2017-09-30

## 2017-09-29 MED ORDER — PROMETHAZINE HCL 25 MG RE SUPP
12.5000 mg | Freq: Four times a day (QID) | RECTAL | Status: DC | PRN
Start: 2017-09-29 — End: 2017-09-30

## 2017-09-29 MED ORDER — PROMETHAZINE HCL 12.5 MG PO TABS
12.5000 mg | ORAL_TABLET | Freq: Four times a day (QID) | ORAL | Status: DC | PRN
Start: 2017-09-29 — End: 2017-09-30

## 2017-09-29 MED ORDER — MISOPROSTOL 100 MCG PO TABS
50.0000 ug | ORAL_TABLET | Freq: Four times a day (QID) | ORAL | Status: AC
Start: 2017-09-29 — End: 2017-09-30
  Administered 2017-09-29 – 2017-09-30 (×3): 50 ug via ORAL
  Filled 2017-09-29: qty 3

## 2017-09-29 MED ORDER — ONDANSETRON 4 MG PO TBDP
4.0000 mg | ORAL_TABLET | Freq: Three times a day (TID) | ORAL | Status: DC | PRN
Start: 2017-09-29 — End: 2017-09-30

## 2017-09-29 MED ORDER — ONDANSETRON HCL 4 MG/2ML IJ SOLN
4.0000 mg | Freq: Three times a day (TID) | INTRAMUSCULAR | Status: DC | PRN
Start: 2017-09-29 — End: 2017-09-30

## 2017-09-29 MED ORDER — ACETAMINOPHEN 325 MG PO TABS
650.0000 mg | ORAL_TABLET | Freq: Four times a day (QID) | ORAL | Status: DC | PRN
Start: 2017-09-29 — End: 2017-09-30

## 2017-09-29 MED ORDER — PROMETHAZINE HCL 25 MG/ML IJ SOLN
6.2500 mg | Freq: Four times a day (QID) | INTRAMUSCULAR | Status: DC | PRN
Start: 2017-09-29 — End: 2017-09-30

## 2017-09-29 NOTE — H&P (Signed)
Subjective:   Kim Booker is a 41 y.o. G1 P0 female with EDC 09/21/2017 at 40 and 6/[redacted] weeks gestation who is being admitted for PROM, labor augmentation.  Her current obstetrical history is significant for advanced maternal age.  Patient reports leaking of clear fluid since 0120 this morning.   Fetal Movement: normal.   Past Medical History:   Diagnosis Date   . AMA (advanced maternal age) multigravida 35+    . Asthma    .        Objective:     Vital signs in last 24 hours:         General:   alert, appears stated age and cooperative   Lungs:   clear to auscultation bilaterally   Heart:   regular rate and rhythm, S1, S2 normal, no murmur, click, rub or gallop   Abdomen:  soft, non-tender; bowel sounds normal; no masses,  no organomegaly and gravid, s=d   Pelvis:  adequate   FHT:  145 BPM   Presentations: cephalic   Cervix:     Dilation: Closed    Effacement: Long    Station:  Floating    Consistency: firm    Position: posterior     Lab Review  See prenatal     Assessment/Plan:   40 and 6/[redacted] weeks gestation.  Not in labor. and PROM  Obstetrical history significant for advanced maternal age.       Risks, benefits, alternatives and possible complications have been discussed in detail with the patient.  Pre-admission, admission, and post admission procedures and expectations were discussed in detail.  All questions answered, all appropriate consents will be signed at the Hospital. Admission is planned for today.   Augmentation: PO Cytotec.

## 2017-09-29 NOTE — Plan of Care (Signed)
Problem: Pain  Goal: Pain at adequate level as identified by patient  Outcome: Progressing  Patient feeling contractions as intermittent tightening in lower abdomen and back.  Denies any need for pain management interventions at this time.

## 2017-09-30 ENCOUNTER — Observation Stay: Payer: No Typology Code available for payment source | Admitting: Anesthesiology

## 2017-09-30 ENCOUNTER — Encounter
Admission: RE | Disposition: A | Discharge status: Home / Self Care | Payer: Self-pay | Source: Ambulatory Visit | Attending: Obstetrics & Gynecology

## 2017-09-30 SURGERY — Surgical Case
Anesthesia: Regional | Site: Abdomen | Wound class: Clean Contaminated

## 2017-09-30 MED ORDER — OXYTOCIN-SODIUM CHLORIDE 30-0.9 UT/500ML-% IV SOLN
INTRAVENOUS | Status: AC
Start: 2017-09-30 — End: ?
  Filled 2017-09-30: qty 500

## 2017-09-30 MED ORDER — PROMETHAZINE HCL 25 MG/ML IJ SOLN
6.2500 mg | Freq: Once | INTRAMUSCULAR | Status: DC | PRN
Start: 2017-09-30 — End: 2017-10-03

## 2017-09-30 MED ORDER — PHENYLEPHRINE 100 MCG/ML IN NACL 0.9% IV SOSY
PREFILLED_SYRINGE | INTRAVENOUS | Status: AC
Start: 2017-09-30 — End: ?
  Filled 2017-09-30: qty 5

## 2017-09-30 MED ORDER — MORPHINE SULFATE (PF) 1 MG/ML IJ SOLN
INTRAMUSCULAR | Status: AC
Start: 2017-09-30 — End: ?
  Filled 2017-09-30: qty 10

## 2017-09-30 MED ORDER — HYDROMORPHONE HCL 1 MG/ML IJ SOLN
0.5000 mg | INTRAMUSCULAR | Status: DC | PRN
Start: 2017-09-30 — End: 2017-09-30

## 2017-09-30 MED ORDER — NALBUPHINE HCL 10 MG/ML IJ SOLN
2.5000 mg | INTRAMUSCULAR | Status: DC | PRN
Start: 2017-09-30 — End: 2017-10-03

## 2017-09-30 MED ORDER — ACETAMINOPHEN 500 MG PO TABS
1000.0000 mg | ORAL_TABLET | Freq: Four times a day (QID) | ORAL | Status: DC | PRN
Start: 2017-09-30 — End: 2017-09-30

## 2017-09-30 MED ORDER — PROMETHAZINE HCL 25 MG RE SUPP
12.5000 mg | Freq: Four times a day (QID) | RECTAL | Status: DC | PRN
Start: 2017-09-30 — End: 2017-10-03

## 2017-09-30 MED ORDER — MORPHINE SULFATE (PF) 1 MG/ML IJ SOLN
INTRAMUSCULAR | Status: DC | PRN
Start: 2017-09-30 — End: 2017-09-30
  Administered 2017-09-30: 3 mg via EPIDURAL
  Administered 2017-09-30: 2 mg via INTRAVENOUS

## 2017-09-30 MED ORDER — SENNOSIDES-DOCUSATE SODIUM 8.6-50 MG PO TABS
1.0000 | ORAL_TABLET | Freq: Every evening | ORAL | Status: DC | PRN
Start: 2017-09-30 — End: 2017-10-03

## 2017-09-30 MED ORDER — FENTANYL-BUPIVACAINE-NACL 0.2-0.125-0.9 MG/100ML-% EP SOLN
EPIDURAL | Status: AC
Start: 2017-09-30 — End: 2017-10-01
  Filled 2017-09-30: qty 100

## 2017-09-30 MED ORDER — LACTATED RINGERS IV SOLN
INTRAVENOUS | Status: DC
Start: 2017-09-30 — End: 2017-10-03

## 2017-09-30 MED ORDER — FENTANYL CITRATE (PF) 50 MCG/ML IJ SOLN (WRAP)
100.0000 ug | Freq: Once | INTRAMUSCULAR | Status: DC
Start: 2017-09-30 — End: 2017-09-30

## 2017-09-30 MED ORDER — KETOROLAC TROMETHAMINE 30 MG/ML IJ SOLN
INTRAMUSCULAR | Status: AC
Start: 2017-09-30 — End: ?
  Filled 2017-09-30: qty 1

## 2017-09-30 MED ORDER — MISOPROSTOL 200 MCG PO TABS
800.0000 ug | ORAL_TABLET | Freq: Once | ORAL | Status: DC | PRN
Start: 2017-09-30 — End: 2017-10-03

## 2017-09-30 MED ORDER — METHYLERGONOVINE MALEATE 0.2 MG/ML IJ SOLN
200.0000 ug | INTRAMUSCULAR | Status: DC | PRN
Start: 2017-09-30 — End: 2017-09-30
  Administered 2017-09-30: 22:00:00 0.2 mg via INTRAMUSCULAR
  Filled 2017-09-30: qty 1

## 2017-09-30 MED ORDER — LIDOCAINE-EPINEPHRINE 2 %-1:200000 IJ SOLN
INTRAMUSCULAR | Status: AC
Start: 2017-09-30 — End: ?
  Filled 2017-09-30: qty 20

## 2017-09-30 MED ORDER — LACTATED RINGERS IV SOLN
INTRAVENOUS | Status: DC | PRN
Start: 2017-09-30 — End: 2017-09-30

## 2017-09-30 MED ORDER — ONDANSETRON HCL 4 MG/2ML IJ SOLN
4.0000 mg | Freq: Once | INTRAMUSCULAR | Status: DC | PRN
Start: 2017-09-30 — End: 2017-10-03

## 2017-09-30 MED ORDER — BUPIVACAINE HCL (PF) 0.25 % IJ SOLN
INTRAMUSCULAR | Status: DC | PRN
Start: 2017-09-30 — End: 2017-09-30
  Administered 2017-09-30: 3 mL via EPIDURAL
  Administered 2017-09-30: 2 mL via EPIDURAL
  Administered 2017-09-30: 3 mL via EPIDURAL

## 2017-09-30 MED ORDER — CEFAZOLIN SODIUM 1 G IJ SOLR
INTRAMUSCULAR | Status: DC | PRN
Start: 2017-09-30 — End: 2017-09-30
  Administered 2017-09-30: 2 g via INTRAVENOUS

## 2017-09-30 MED ORDER — EPHEDRINE SULFATE 50 MG/ML IJ/IV SOLN (WRAP)
Status: AC
Start: 2017-09-30 — End: 2017-09-30
  Administered 2017-09-30: 16:00:00 10 mg via INTRAVENOUS
  Filled 2017-09-30: qty 1

## 2017-09-30 MED ORDER — OXYTOCIN 10 UNIT/ML IJ SOLN
INTRAMUSCULAR | Status: AC
Start: 2017-09-30 — End: ?
  Filled 2017-09-30: qty 3

## 2017-09-30 MED ORDER — OXYTOCIN-SODIUM CHLORIDE 30-0.9 UT/500ML-% IV SOLN
1.0000 m[IU]/min | INTRAVENOUS | Status: DC
Start: 2017-09-30 — End: 2017-09-30

## 2017-09-30 MED ORDER — BUPIVACAINE HCL (PF) 0.25 % IJ SOLN
30.0000 mL | Freq: Once | INTRAMUSCULAR | Status: DC
Start: 2017-09-30 — End: 2017-09-30

## 2017-09-30 MED ORDER — ACETAMINOPHEN 325 MG PO TABS
650.0000 mg | ORAL_TABLET | ORAL | Status: DC | PRN
Start: 2017-09-30 — End: 2017-09-30

## 2017-09-30 MED ORDER — SODIUM CHLORIDE 0.9 % IJ SOLN
3.0000 mL | Freq: Three times a day (TID) | INTRAMUSCULAR | Status: DC
Start: 2017-10-02 — End: 2017-10-03

## 2017-09-30 MED ORDER — WITCH HAZEL-GLYCERIN EX PADS
1.0000 | MEDICATED_PAD | CUTANEOUS | Status: DC | PRN
Start: 2017-09-30 — End: 2017-10-03

## 2017-09-30 MED ORDER — PHENYLEPHRINE HCL 10 MG/ML IV SOLN (WRAP)
Status: DC | PRN
Start: 2017-09-30 — End: 2017-09-30
  Administered 2017-09-30 (×5): 100 ug via INTRAVENOUS

## 2017-09-30 MED ORDER — PROMETHAZINE HCL 12.5 MG PO TABS
12.5000 mg | ORAL_TABLET | Freq: Four times a day (QID) | ORAL | Status: DC | PRN
Start: 2017-09-30 — End: 2017-10-03

## 2017-09-30 MED ORDER — DEXAMETHASONE SODIUM PHOSPHATE 4 MG/ML IJ SOLN
INTRAMUSCULAR | Status: AC
Start: 2017-09-30 — End: ?
  Filled 2017-09-30: qty 1

## 2017-09-30 MED ORDER — ONDANSETRON HCL 4 MG/2ML IJ SOLN
4.0000 mg | Freq: Three times a day (TID) | INTRAMUSCULAR | Status: DC | PRN
Start: 2017-09-30 — End: 2017-10-03

## 2017-09-30 MED ORDER — ONDANSETRON HCL 4 MG/2ML IJ SOLN
INTRAMUSCULAR | Status: DC | PRN
Start: 2017-09-30 — End: 2017-09-30
  Administered 2017-09-30 (×2): 4 mg via INTRAVENOUS

## 2017-09-30 MED ORDER — CALCIUM CARBONATE ANTACID 500 MG PO CHEW
1000.0000 mg | CHEWABLE_TABLET | Freq: Three times a day (TID) | ORAL | Status: DC | PRN
Start: 2017-09-30 — End: 2017-09-30

## 2017-09-30 MED ORDER — PROMETHAZINE HCL 12.5 MG PO TABS
12.5000 mg | ORAL_TABLET | Freq: Four times a day (QID) | ORAL | Status: DC | PRN
Start: 2017-09-30 — End: 2017-09-30

## 2017-09-30 MED ORDER — ONDANSETRON HCL 4 MG/2ML IJ SOLN
4.0000 mg | Freq: Three times a day (TID) | INTRAMUSCULAR | Status: DC | PRN
Start: 2017-09-30 — End: 2017-09-30

## 2017-09-30 MED ORDER — OXYTOCIN-SODIUM CHLORIDE 30-0.9 UT/500ML-% IV SOLN
INTRAVENOUS | Status: AC
Start: 2017-09-30 — End: 2017-09-30
  Administered 2017-09-30: 05:00:00 1 m[IU]/min via INTRAVENOUS
  Filled 2017-09-30: qty 500

## 2017-09-30 MED ORDER — MAGNESIUM HYDROXIDE 400 MG/5ML PO SUSP
30.0000 mL | Freq: Four times a day (QID) | ORAL | Status: DC | PRN
Start: 2017-09-30 — End: 2017-10-03

## 2017-09-30 MED ORDER — ONDANSETRON 4 MG PO TBDP
4.0000 mg | ORAL_TABLET | Freq: Three times a day (TID) | ORAL | Status: DC | PRN
Start: 2017-09-30 — End: 2017-09-30

## 2017-09-30 MED ORDER — ONDANSETRON HCL 4 MG/2ML IJ SOLN
INTRAMUSCULAR | Status: AC
Start: 2017-09-30 — End: ?
  Filled 2017-09-30: qty 2

## 2017-09-30 MED ORDER — ACETAMINOPHEN 325 MG PO TABS
650.0000 mg | ORAL_TABLET | Freq: Four times a day (QID) | ORAL | Status: DC | PRN
Start: 2017-09-30 — End: 2017-10-03
  Administered 2017-10-03: 650 mg via ORAL
  Filled 2017-09-30: qty 2

## 2017-09-30 MED ORDER — TETANUS-DIPHTH-ACELL PERTUSSIS 5-2.5-18.5 LF-MCG/0.5 IM SUSP
0.5000 mL | INTRAMUSCULAR | Status: DC | PRN
Start: 2017-09-30 — End: 2017-09-30

## 2017-09-30 MED ORDER — LIDOCAINE 1% BUFFERED - CNR/OUTSOURCED
INTRAMUSCULAR | Status: AC
Start: 2017-09-30 — End: 2017-10-01
  Filled 2017-09-30: qty 1

## 2017-09-30 MED ORDER — PRENATAL AD PO TABS
1.0000 | ORAL_TABLET | Freq: Every day | ORAL | Status: DC
Start: 2017-10-01 — End: 2017-10-03
  Administered 2017-10-02 – 2017-10-03 (×2): 1 via ORAL
  Filled 2017-09-30 (×2): qty 1

## 2017-09-30 MED ORDER — OXYTOCIN-SODIUM CHLORIDE 30-0.9 UT/500ML-% IV SOLN
2.0000 m[IU]/min | INTRAVENOUS | Status: DC
Start: 2017-09-30 — End: 2017-10-03

## 2017-09-30 MED ORDER — DEXTROSE 5 % IV SOLN
2.0000 g | INTRAVENOUS | Status: DC
Start: 2017-09-30 — End: 2017-09-30

## 2017-09-30 MED ORDER — PROMETHAZINE HCL 25 MG/ML IJ SOLN
6.2500 mg | Freq: Once | INTRAMUSCULAR | Status: DC | PRN
Start: 2017-09-30 — End: 2017-09-30

## 2017-09-30 MED ORDER — OXYTOCIN-SODIUM CHLORIDE 30-0.9 UT/500ML-% IV SOLN
7.5000 [IU]/h | INTRAVENOUS | Status: AC
Start: 2017-09-30 — End: 2017-10-01

## 2017-09-30 MED ORDER — OXYTOCIN 10 UNIT/ML IJ SOLN
INTRAMUSCULAR | Status: DC | PRN
Start: 2017-09-30 — End: 2017-09-30
  Administered 2017-09-30: 30 [IU] via INTRAVENOUS

## 2017-09-30 MED ORDER — METHYLERGONOVINE MALEATE 0.2 MG/ML IJ SOLN
200.0000 ug | INTRAMUSCULAR | Status: DC | PRN
Start: 2017-09-30 — End: 2017-10-03

## 2017-09-30 MED ORDER — DOCUSATE SODIUM 100 MG PO CAPS
200.0000 mg | ORAL_CAPSULE | Freq: Two times a day (BID) | ORAL | Status: DC
Start: 2017-09-30 — End: 2017-10-03
  Administered 2017-10-01 – 2017-10-03 (×4): 200 mg via ORAL
  Filled 2017-09-30 (×4): qty 2

## 2017-09-30 MED ORDER — KETOROLAC TROMETHAMINE 30 MG/ML IJ SOLN
30.0000 mg | Freq: Four times a day (QID) | INTRAMUSCULAR | Status: AC
Start: 2017-09-30 — End: 2017-10-01
  Administered 2017-10-01 (×3): 30 mg via INTRAVENOUS
  Filled 2017-09-30 (×3): qty 1

## 2017-09-30 MED ORDER — MEPERIDINE HCL 25 MG/ML IJ SOLN
12.5000 mg | INTRAMUSCULAR | Status: DC | PRN
Start: 2017-09-30 — End: 2017-09-30

## 2017-09-30 MED ORDER — SOD CITRATE-CITRIC ACID 500-334 MG/5ML PO SOLN
30.0000 mL | Freq: Once | ORAL | Status: DC | PRN
Start: 2017-09-30 — End: 2017-09-30

## 2017-09-30 MED ORDER — LACTATED RINGERS IV BOLUS
1000.0000 mL | Freq: Once | INTRAVENOUS | Status: DC
Start: 2017-09-30 — End: 2017-09-30

## 2017-09-30 MED ORDER — ACETAMINOPHEN 650 MG RE SUPP
650.0000 mg | Freq: Four times a day (QID) | RECTAL | Status: DC | PRN
Start: 2017-09-30 — End: 2017-10-03

## 2017-09-30 MED ORDER — LIDOCAINE HCL (PF) 2 % IJ SOLN
INTRAMUSCULAR | Status: DC | PRN
Start: 2017-09-30 — End: 2017-09-30
  Administered 2017-09-30 (×3): 5 mL

## 2017-09-30 MED ORDER — LACTATED RINGERS IV BOLUS
1000.0000 mL | Freq: Once | INTRAVENOUS | Status: AC
Start: 2017-09-30 — End: 2017-09-30
  Administered 2017-09-30: 19:00:00 1000 mL via INTRAVENOUS

## 2017-09-30 MED ORDER — TERBUTALINE SULFATE 1 MG/ML IJ SOLN
0.2500 mg | Freq: Once | INTRAMUSCULAR | Status: DC | PRN
Start: 2017-09-30 — End: 2017-09-30

## 2017-09-30 MED ORDER — PROMETHAZINE HCL 25 MG/ML IJ SOLN
6.2500 mg | Freq: Four times a day (QID) | INTRAMUSCULAR | Status: DC | PRN
Start: 2017-09-30 — End: 2017-10-03
  Administered 2017-09-30: 23:00:00 6.25 mg via INTRAVENOUS
  Filled 2017-09-30: qty 1

## 2017-09-30 MED ORDER — NALOXONE HCL 0.4 MG/ML IJ SOLN (WRAP)
0.1000 mg | INTRAMUSCULAR | Status: DC | PRN
Start: 2017-09-30 — End: 2017-09-30

## 2017-09-30 MED ORDER — OXYCODONE-ACETAMINOPHEN 5-325 MG PO TABS
2.0000 | ORAL_TABLET | ORAL | Status: DC | PRN
Start: 2017-09-30 — End: 2017-10-03

## 2017-09-30 MED ORDER — ACETAMINOPHEN 500 MG PO TABS
1000.0000 mg | ORAL_TABLET | Freq: Four times a day (QID) | ORAL | Status: DC | PRN
Start: 2017-09-30 — End: 2017-10-03
  Administered 2017-09-30: 1000 mg via ORAL
  Filled 2017-09-30: qty 2

## 2017-09-30 MED ORDER — ONDANSETRON HCL 4 MG/2ML IJ SOLN
4.0000 mg | Freq: Once | INTRAMUSCULAR | Status: DC | PRN
Start: 2017-09-30 — End: 2017-09-30

## 2017-09-30 MED ORDER — EPHEDRINE SULFATE 50 MG/ML IJ/IV SOLN (WRAP)
10.0000 mg | Status: AC | PRN
Start: 2017-09-30 — End: 2017-09-30
  Administered 2017-09-30: 17:00:00 10 mg via INTRAVENOUS

## 2017-09-30 MED ORDER — LACTATED RINGERS IV SOLN
125.0000 mL/h | INTRAVENOUS | Status: AC
Start: 2017-10-01 — End: 2017-10-02
  Administered 2017-10-01: 01:00:00 125 mL/h via INTRAVENOUS

## 2017-09-30 MED ORDER — PROMETHAZINE HCL 25 MG/ML IJ SOLN
6.2500 mg | Freq: Four times a day (QID) | INTRAMUSCULAR | Status: DC | PRN
Start: 2017-09-30 — End: 2017-09-30

## 2017-09-30 MED ORDER — MEASLES, MUMPS & RUBELLA VAC SC INJ
0.5000 mL | INJECTION | SUBCUTANEOUS | Status: DC | PRN
Start: 2017-09-30 — End: 2017-10-03

## 2017-09-30 MED ORDER — LAN-O-SOOTHE EX CREA
TOPICAL_CREAM | CUTANEOUS | Status: DC | PRN
Start: 2017-09-30 — End: 2017-10-03
  Filled 2017-09-30: qty 7

## 2017-09-30 MED ORDER — OXYTOCIN-SODIUM CHLORIDE 30-0.9 UT/500ML-% IV SOLN
7.5000 [IU]/h | INTRAVENOUS | Status: AC
Start: 2017-09-30 — End: 2017-10-01
  Administered 2017-09-30: 21:00:00 7.5 [IU]/h via INTRAVENOUS

## 2017-09-30 MED ORDER — DIPHENHYDRAMINE HCL 50 MG/ML IJ SOLN
12.5000 mg | INTRAMUSCULAR | Status: DC | PRN
Start: 2017-09-30 — End: 2017-10-03

## 2017-09-30 MED ORDER — FENTANYL-BUPIVACAINE-NACL 0.2-0.125-0.9 MG/100ML-% EP SOLN
EPIDURAL | Status: DC
Start: 2017-09-30 — End: 2017-10-03

## 2017-09-30 MED ORDER — HYDROCORTISONE 1 % EX OINT
TOPICAL_OINTMENT | Freq: Three times a day (TID) | CUTANEOUS | Status: DC | PRN
Start: 2017-09-30 — End: 2017-10-03

## 2017-09-30 MED ORDER — BISACODYL 10 MG RE SUPP
10.0000 mg | Freq: Every day | RECTAL | Status: DC | PRN
Start: 2017-09-30 — End: 2017-10-03

## 2017-09-30 MED ORDER — ACETAMINOPHEN 650 MG RE SUPP
650.0000 mg | RECTAL | Status: DC | PRN
Start: 2017-09-30 — End: 2017-09-30

## 2017-09-30 MED ORDER — NALOXONE HCL 0.4 MG/ML IJ SOLN (WRAP)
0.1000 mg | INTRAMUSCULAR | Status: DC | PRN
Start: 2017-09-30 — End: 2017-10-03

## 2017-09-30 MED ORDER — OXYTOCIN-SODIUM CHLORIDE 30-0.9 UT/500ML-% IV SOLN
1.0000 m[IU]/min | INTRAVENOUS | Status: DC
Start: 2017-09-30 — End: 2017-09-30
  Administered 2017-09-30: 05:00:00 1 m[IU]/min via INTRAVENOUS

## 2017-09-30 MED ORDER — PROMETHAZINE HCL 25 MG RE SUPP
12.5000 mg | Freq: Four times a day (QID) | RECTAL | Status: DC | PRN
Start: 2017-09-30 — End: 2017-09-30

## 2017-09-30 MED ORDER — EPHEDRINE SULFATE 50 MG/ML IJ/IV SOLN (WRAP)
Status: AC
Start: 2017-09-30 — End: ?
  Filled 2017-09-30: qty 1

## 2017-09-30 MED ORDER — CEFAZOLIN SODIUM 1 G IJ SOLR
INTRAMUSCULAR | Status: AC
Start: 2017-09-30 — End: ?
  Filled 2017-09-30: qty 2000

## 2017-09-30 MED ORDER — LIDOCAINE-EPINEPHRINE 1.5 %-1:200000 IJ SOLN
INTRAMUSCULAR | Status: DC | PRN
Start: 2017-09-30 — End: 2017-09-30
  Administered 2017-09-30: 3 mL via EPIDURAL

## 2017-09-30 MED ORDER — MISOPROSTOL 200 MCG PO TABS
800.0000 ug | ORAL_TABLET | Freq: Once | ORAL | Status: DC | PRN
Start: 2017-09-30 — End: 2017-09-30

## 2017-09-30 MED ORDER — METOCLOPRAMIDE HCL 5 MG/ML IJ SOLN
INTRAMUSCULAR | Status: AC
Start: 2017-09-30 — End: ?
  Filled 2017-09-30: qty 2

## 2017-09-30 MED ORDER — BUPIVACAINE HCL (PF) 0.25 % IJ SOLN
INTRAMUSCULAR | Status: AC
Start: 2017-09-30 — End: 2017-09-30
  Filled 2017-09-30: qty 10

## 2017-09-30 MED ORDER — ONDANSETRON 4 MG PO TBDP
4.0000 mg | ORAL_TABLET | Freq: Three times a day (TID) | ORAL | Status: DC | PRN
Start: 2017-09-30 — End: 2017-10-03

## 2017-09-30 MED ORDER — IBUPROFEN 600 MG PO TABS
600.0000 mg | ORAL_TABLET | Freq: Four times a day (QID) | ORAL | Status: DC
Start: 2017-10-01 — End: 2017-10-03
  Administered 2017-10-01 – 2017-10-03 (×7): 600 mg via ORAL
  Filled 2017-09-30 (×7): qty 1

## 2017-09-30 MED ORDER — BENZOCAINE 20% +/- MENTHOL 0.5% EX AERO (WRAP)
1.0000 | INHALATION_SPRAY | CUTANEOUS | Status: DC | PRN
Start: 2017-09-30 — End: 2017-10-03

## 2017-09-30 SURGICAL SUPPLY — 19 items
CLOTH GLUCONATE 2% CHLORHEXDNE (Personal Protection) ×4 IMPLANT
DRESSING ABDOMINAL ONE-SZ (Dressing) ×2 IMPLANT
DRESSING ISLAND TELFA 4X10 (Dressing) ×2 IMPLANT
GLOVE SURG BIOGEL INDIC SZ 6.0 (Glove) ×12 IMPLANT
INACTIVE USE LAWSON 120900 (Gown) ×2 IMPLANT
KIT C-SECTION DELIVERY (Kits) ×2 IMPLANT
SLEEVE TED SCD LARGE (Procedure Accessories) ×2 IMPLANT
SOLUTION IRR LR 3L ARTHMTC LF PLS CNTNR (Irrigation Solutions) ×2
SOLUTION IRRIGATION LACTATED RINGERS (Irrigation Solutions) ×2
SOLUTION IRRIGATION LACTATED RINGERS 3000 ML PLASTIC CONTAINER (Irrigation Solutions) ×2 IMPLANT
SUTURE ABS 0 CT1 COAT VCL 27IN BRD VIOL (Suture) ×2
SUTURE ABS 3-0 CT1 VCL 36IN BRD COAT UD (Suture) ×2
SUTURE ABS 4-0 PC5 MNCRL MTPS 18IN MFL (Suture) ×1
SUTURE COATED VICRYL 0 CT-1 L27 IN BRAID (Suture) ×2
SUTURE COATED VICRYL 0 CT-1 L27 IN BRAID VIOLET ABSORBABLE (Suture) ×2 IMPLANT
SUTURE COATED VICRYL 3-0 CT-1 L36 IN (Suture) ×2
SUTURE COATED VICRYL 3-0 CT-1 L36 IN BRAID COATED UNDYED ABSORBABLE (Suture) ×2 IMPLANT
SUTURE MONOCRYL 4-0 PC-5 L18 IN (Suture) ×1
SUTURE MONOCRYL 4-0 PC-5 L18 IN MONOFILAMENT UNDYED ABSORBABLE (Suture) ×1 IMPLANT

## 2017-09-30 NOTE — Progress Notes (Signed)
Assumed care of patient at 0800    S:   Pt coping very well with UC's; beginning to feel uncomfortable    O:   VSS, Afebrile  Vitals:    09/30/17 0700 09/30/17 0715 09/30/17 0730 09/30/17 0810   BP: 103/78  105/82 101/76   Pulse: (!) 104  92 98   Resp:   20 18   Temp:  97.9 F (36.6 C)     TempSrc:  Oral     Weight:       Height:            Dilation: Closed  Effacement (%): 50  Cervical Characteristics: Mid-position (medium)  Station: -3  Presentation: Vertex  Method: Manual  OB Examiner: Savan Ruta F    FHR Baseline: 130, moderate    Accels: present    Decels: none    UC's q 2-3 moderate to palpation    Pitocin at 10    BOW: leaking clear       A:    - 41 y.o. G1P0 IUP @ 40 weeks with PROM; prolonged ROM; not in labor; remains    afebrile     - FHR category I    P:    Continue oxytocin titration;    Labor support for desired un-medicated childbirth; labor epidural PRN   Reassess one to two hours PRN   Anticipate active labor      Cristal Generous, CNM  09/30/2017  8:38 AM

## 2017-09-30 NOTE — Plan of Care (Signed)
Problem: Vaginal/Cesarean Delivery  Goal: Evidence of Fetal Well Being  Outcome: Progressing  Support positions helpful for labor initiation and progress.  Increase IV fluids and encourage po fluids to keep hydrated and therefore help baby when strip minimal variability.

## 2017-09-30 NOTE — Progress Notes (Signed)
Gave report and then in room to finish and check cassette and Pitocin.

## 2017-09-30 NOTE — Progress Notes (Signed)
Given report to A. Dabirzadeh, RN at 1207 for break and reassumed care at 1234.

## 2017-09-30 NOTE — Plan of Care (Signed)
Problem: Vaginal/Cesarean Delivery  Goal: Postpartum management of pain/discomfort  Outcome: Progressing  Goal: Breasts are soft with nipple integrity intact  Outcome: Progressing  Goal: Gastrointestinal/Urinary management  Outcome: Progressing  Goal: Uterine management  Outcome: Progressing  Goal: Perineum will be clean, dry, and intact and without discharge or hematoma  Outcome: Progressing  Goal: Evidence of positive mother-baby interactions  Outcome: Progressing

## 2017-09-30 NOTE — Progress Notes (Signed)
S:   Pt coping marginally with UC's; reports very uncomfortable with increaseed pressure    O:   VSS, Afebrile       Dilation: 2  Effacement (%): 80  Cervical Characteristics: Mid-position  Station: -2  Presentation: Vertex  Method: Manual  OB Examiner: Travis Mastel Friedline    FHR Baseline: 130, minimal at times; now moderate    Accels: present    Decels: occasional late    UC's q 2-3 moderate to palpation    Pitocin at 22    BOW: leaking clear     A:    - 41 y.o. G1P0 IUP @ 41 weeks; IOL for PROM and prolonged ROM  x37 hours with minimal cervical change     - FHR category II    P:    Anesthesia consult for epidural   Continue oxytocin titration to 30 MVU PRN   Reassess one to two hours PRN   Covering OB aware of plan of care      Toribio Harbour [place], CNM  09/30/2017  2:45 PM

## 2017-09-30 NOTE — Progress Notes (Signed)
Report given to K. Shoaf RN.  Dr. Verlene Mayer in room

## 2017-09-30 NOTE — Progress Notes (Signed)
FHR dropped to 75 then to 130 then back down --noted BP 82/41, turning side to side.  Monica in room helping with turning, O2 on and IV still bolusing.  CAlling for Dr. Gerilyn Pilgrim

## 2017-09-30 NOTE — Progress Notes (Signed)
S:   Pt coping very well with UC's.     O:   VSS, Afebrile  Vitals:    09/29/17 2130 09/29/17 2330 09/30/17 0126 09/30/17 0322   BP:  96/67  92/61   Pulse:  92  62   Resp: 15 15 16 15    Temp: 98.2 F (36.8 C) 98.2 F (36.8 C) 97.9 F (36.6 C) 98.1 F (36.7 C)   TempSrc: Oral Oral Oral Oral   Weight:       Height:            Dilation: Closed  Effacement (%): 40  Cervical Characteristics: Posterior  Station: -3  Presentation: Vertex  Method: Manual  OB Examiner: L Ritchie, RN    FHR Baseline: 130, moderate    Accels: present    Decels: none    UC's q 3 min mild to moderate to palpation    Pitocin at 1 mu/min    BOW: clear      A:    - IUP at 41 w   -PROM for clear fluid on 09/29/2017 at 0120   -Early labor, no cervical change    - FHR category 1    P:    Labor support   Encourage ambulation and frequent position changes   Reassess one to two hours PRN   Initiate Pitocin infusion per unit protocol   Anticipate active labor      Maia Petties, CNM  09/30/2017  5:15 AM

## 2017-09-30 NOTE — Transfer of Care (Signed)
Anesthesia Transfer of Care Note    Patient: Kim Booker    Procedures performed: Procedure(s):  CESAREAN SECTION    Anesthesia type: Epidural    Patient location:OB PACU    Last vitals:   Vitals:    09/30/17 2030   BP: 111/68   Pulse: (!) 105   Resp: 12   Temp: 36.5 C (97.7 F)   SpO2: 99%       Post pain: Patient not complaining of pain, continue current therapy      Mental Status:awake and alert     Respiratory Function: tolerating room air    Cardiovascular: stable    Nausea/Vomiting: patient not complaining of nausea or vomiting    Hydration Status: adequate    Post assessment: no apparent anesthetic complications    Signed by: Oris Drone  09/30/17 8:31 PM

## 2017-09-30 NOTE — Progress Notes (Signed)
Patient comfortable with epidural  Pt HR 110-140  Cervix:  Dilation: 2  Effacement (%): 80  Cervical Characteristics: Mid-position  Station: -2  Presentation: Vertex  Method: Manual  OB Examiner: Monica Friedline    Contractions: irregular  FHTs: 150-160, decreased variability      Impression:  IUP at term, SROM approximately 42 hours ago, not in labor despite induction attempts and now with maternal and fetal tachycardia, concern for development of chorioamnionitis.    Plan:  Recommend to pt and her husband that we proceed with primary c-section.  Discussed concerns for maternal and fetal health given prolonged ROM and not in active labor.  Risks/benefits reviewed.  Pt in agreement.      Buena Irish, MD

## 2017-09-30 NOTE — Anesthesia Procedure Notes (Signed)
Epidural    Patient location during procedure: L&D  Reason for block: Labor or C-section  Start time: 09/30/2017 3:34 PM  End time: 09/30/2017 3:59 PM    Staffing  Anesthesiologist: Royden Purl RAO  Performed: anesthesiologist     Pre-procedure Checklist   Completed: patient identified, site marked, surgical consent, pre-op evaluation, timeout performed, risks and benefits discussed, monitors and equipment checked, anesthesia consent given and correct site      Epidural  Patient monitoring: Pulse oximetry and NIBP    Premedication: Meaningful Contact Maintained  Patient position: sitting    Skin Local: lidocaine 1%  Dose: 2 mL    Attempts  Number of attempts: 1                          Needle type: Touhy needle   Needle gauge: 17  Injection technique: LOR saline    CSF Return: No   Blood Return: No  Paresthesia Pain: No    Needle Placement  Needle type: Touhy needle   Needle gauge: 17  Injection technique: LOR saline  CSF Return: No  Blood Return: No          Paresthesia Pain: No    Catheter Placement   Catheter type: side hole  Catheter size: 18 G  Catheter at skin depth: 5 cm  CSF Return: No  Blood Return: No  Test Dose:1.5 % lidocaine    Incremental injection: yes  Injection made incrementally with aspirations every 3 mL.    No Catheter IV/SA Signs or Symptoms    Assessment   Block Outcome: no complications and successful block

## 2017-09-30 NOTE — Progress Notes (Signed)
Kim Booker CNM discussed with pt and husband plan of care now that 32hrs post ROM to proceed more progressively on Pitocin to induce good regular pattern of contractions since cervix still closed.  Both pt and husband agreeable.  Also discuss and placed pt in far L position with peanut ball to help move baby over from R side.

## 2017-09-30 NOTE — Anesthesia Postprocedure Evaluation (Signed)
Anesthesia Post Evaluation    Patient: Kim Booker    Procedure(s):  CESAREAN SECTION    Anesthesia type: Epidural    Last Vitals:   Vitals:    09/30/17 2030   BP: 111/68   Pulse: (!) 105   Resp: 12   Temp: 36.5 C (97.7 F)   SpO2: 99%                           QCDR IS INCOMPLETE      Anesthesia Qualified Clinical Data Registry 2018    PACU Reintubation  Did the Patient have general anesthesia with intubation: No        PONV Adult  Is the patient aged 39 or older: Yes  Did the patient receive recieve a general anesthestic: No          PONV Pediatric  Is the patient aged 4-17? No            PACU Transfer Checklist Protocol  Was the patient transferred to the PACU at the conclusion of surgery? Yes  Was a checklist or transfer protocol used? Yes    ICU Transfer Checklist Protocol  Was the patient transferred to the ICU at the conclusion of surgery? No      Post-op Pain Assessment Prior to Anesthesia Care End  Age >=18 and assessed for pain in PACU: Yes  Pacu pain score <7/10: Yes      Perioperative Mortality  Perioperative mortality prior to Anesthesia end time: No    Perioperative Cardiac Arrest  Did the patient have an unanticipated intraoperative cardiac arrest between anesthesia start time and anesthesia end time? No    Unplanned Admission to ICU  Did the patient have an unplanned admission to the ICU (not initially anticipated at anesthesia start time)? No      Signed by: Oris Drone, 09/30/2017 8:31 PM

## 2017-09-30 NOTE — Progress Notes (Signed)
Received report and assumed care of pt in room.  Up to Br and up and about in room.

## 2017-09-30 NOTE — Progress Notes (Signed)
Receiving report on pt from S. Restaurant manager, fast food at bedside. Audible FHR decel down to 0's-110's.  Pt repositioned to left side then right side, O2 via mask @ 10L administered.  Monica summoned to room and SVE performed.  Pt hypotensive with BP of 82/41.  10 mg of ephedrine given IVP.  Pitocin off.  Pt repositioned to hands and knees.  FHR finally recovered after 10 min to baseline of 130's.

## 2017-09-30 NOTE — Progress Notes (Signed)
Taking reoprt

## 2017-09-30 NOTE — Progress Notes (Signed)
Pt still up in BR straining to have BM because thinks it will help labor.  Reassured if need to go is good but not to strain.  RN explain need to monitor baby closely while on Pitocin; discuss can put on tele monitors so make sure baby doing ok with contractions while baby up more extended times.

## 2017-09-30 NOTE — Progress Notes (Signed)
Continues to leak clear fluid at times, esp up to BR.

## 2017-09-30 NOTE — Progress Notes (Signed)
This note also relates to the following rows which could not be included:  Heart Rate - Cannot attach notes to unvalidated device data  BP - Cannot attach notes to unvalidated device data    Spoke with monica to let her know that baby and mom are tachycardic.  Maternal temp at 1800 was 99.108F oral.  Was told to stop the pitocin

## 2017-09-30 NOTE — Progress Notes (Signed)
Crying and states so much more pain.  Discussed Kim Booker coming and pt would like to be checked so called Kim Booker to come.

## 2017-09-30 NOTE — Op Note (Signed)
Cesarean Section Procedure Note    Indications: failed induction, SROM, maternal/fetal tachycardia    Pre-operative Diagnosis: 41 week 0 day pregnancy.    Post-operative Diagnosis: same    Procedure: primary lower uterine transverse c-section    Surgeon: Buena Irish     Assistants: Durene Romans    Anesthesia: Epidural anesthesia        Procedure Details   The risks, benefits, complications, treatment options, and expected outcomes were discussed with the patient.  The patient concurred with the proposed plan, giving informed consent.  The patient was taken to Operating Room with IV running, identified as Kim Booker and the procedure verified as C-Section Delivery. A Time Out was held and the above information confirmed.    She was placed in the dorsal supine position with leftward tilt.  After anesthesia was noted to be adequate, the patient was draped and prepped in the usual sterile manner. A Pfannenstiel incision was made and carried down through the subcutaneous tissue to the fascia with knife and bovie cautery.  A fascial incision was made and extended transversely. The rectus muscles were transected off the fascia, identified in the midline and separated.  The peritoneum was identified and entered. The peritoneal incision was extended superiorly and inferiorly with good visualization of the bladder.  The utero-vesical peritoneal was identified, grasped with pick up, entered sharply with metzenbaum scissors and extended laterally.  The bladder flap was then created digitally and a bladder blade was placed.  The lower uterine segment was then identified and a low transverse uterine incision was made and extended laterally, bluntly.  Amniotomy of clear fluid was noted.  The bladder blade was removed and the infant's head, shoulders and body were then delivered atraumatically.   After the umbilical cord was clamped and cut the infant was handed off to waiting pediatric staff. The placenta was  manually extracted, intact and appeared normal.  The uterus was exteriorized and cleared of all clots and debris.  The uterine outline, tubes and ovaries appeared normal. The uterine incision was then closed with running locked sutures of 0 Vicryl and a second layer of 0 Vicryl in an imbricated fashion was performed.. Hemostasis was observed. The uterus was then returned to the abdomen. The gutters were cleared of all clot/debris.  Instruments and laps were removed from the abdomen.  The muscle was re-approximated with a figure of 8 stitch of 0 vicryl.  The fascia was then reapproximated with running sutures of 0 Vicryl.The subcutaneous tissue was re approximated with interrupted 3-0 vicryl sutures. The skin was reapproximated in a subcuticular fashion with 4-0 vicryl.    Instrument, sponge, and needle counts were correct x2.   The patient received IV antibiotics prior to skin incision..     Findings: viable female infant    Apgars: 8,9    Weight: 9 lbs 1 oz    Estimated Blood Loss:  700 mL           Complications:  None; patient tolerated the procedure well.           Disposition: PACU - hemodynamically stable.           Condition: stable    Attending Attestation: I was present and scrubbed for the entire procedure.

## 2017-09-30 NOTE — Plan of Care (Signed)
Problem: Vaginal/Cesarean Delivery  Goal: Free from Maternal/Fetal Infection  Outcome: Progressing  Prolonged SROM more than 24hrs.  Monitor maternal temp carefully and observe strip for tachycardia.  Category 2 strip at first but 1 now.  GBS negative

## 2017-09-30 NOTE — Progress Notes (Signed)
Informed Dr. Crisoforo Oxford of drop in BP so Ephedrine given per MD.

## 2017-09-30 NOTE — Anesthesia Preprocedure Evaluation (Addendum)
Anesthesia Evaluation    AIRWAY    Mallampati: II    TM distance: >3 FB  Neck ROM: full  Mouth Opening:full   CARDIOVASCULAR    cardiovascular exam normal       DENTAL    no notable dental hx     PULMONARY    pulmonary exam normal     OTHER FINDINGS              Relevant Problems   No relevant active problems               Anesthesia Plan    ASA 2     epidural                     Detailed anesthesia plan: epidural            informed consent obtained    Plan discussed with CRNA.      pertinent labs reviewed             Signed by: Kipp Brood Isla Sabree 09/30/17 3:54 PM

## 2017-09-30 NOTE — Progress Notes (Signed)
Pt feeling a little hot so temp retaken axillary but ok and reassured.  Encouraged fluids.

## 2017-09-30 NOTE — Plan of Care (Signed)
Problem: Pain  Goal: Pain at adequate level as identified by patient  Outcome: Progressing  One hour after completion of c/s delivery, pt denies pain.

## 2017-09-30 NOTE — Anesthesia Postprocedure Evaluation (Signed)
Anesthesia Post Evaluation    Patient: Kim Booker    Procedure(s):  CESAREAN SECTION    Anesthesia type: Epidural    Last Vitals:   Vitals:    09/30/17 2115   BP: 92/55   Pulse:    Resp: 16   Temp:    SpO2: 95%       Patient Location: Phase I PACU      Post Pain: Patient not complaining of pain, continue current therapy    Mental Status: awake    Respiratory Function: tolerating room air    Cardiovascular: stable      Hydration Status: adequate    Post Assessment: no apparent anesthetic complications          Not Complete    Signed by: Robinette Haines Colingo-Fahlberg, 09/30/2017 9:27 PM

## 2017-10-01 LAB — CBC AND DIFFERENTIAL
Absolute NRBC: 0 10*3/uL
Basophils Absolute Automated: 0.03 10*3/uL (ref 0.00–0.20)
Basophils Automated: 0.2 %
Eosinophils Absolute Automated: 0 10*3/uL (ref 0.00–0.70)
Eosinophils Automated: 0 %
Hematocrit: 31 % — ABNORMAL LOW (ref 37.0–47.0)
Hgb: 10.4 g/dL — ABNORMAL LOW (ref 12.0–16.0)
Immature Granulocytes Absolute: 0.13 10*3/uL — ABNORMAL HIGH
Immature Granulocytes: 0.8 %
Lymphocytes Absolute Automated: 1.84 10*3/uL (ref 0.50–4.40)
Lymphocytes Automated: 10.9 %
MCH: 30.9 pg (ref 28.0–32.0)
MCHC: 33.5 g/dL (ref 32.0–36.0)
MCV: 92 fL (ref 80.0–100.0)
MPV: 11.1 fL (ref 9.4–12.3)
Monocytes Absolute Automated: 0.99 10*3/uL (ref 0.00–1.20)
Monocytes: 5.9 %
Neutrophils Absolute: 13.9 10*3/uL — ABNORMAL HIGH (ref 1.80–8.10)
Neutrophils: 82.2 %
Nucleated RBC: 0 /100 WBC (ref 0.0–1.0)
Platelets: 182 10*3/uL (ref 140–400)
RBC: 3.37 10*6/uL — ABNORMAL LOW (ref 4.20–5.40)
RDW: 15 % (ref 12–15)
WBC: 16.89 10*3/uL — ABNORMAL HIGH (ref 3.50–10.80)

## 2017-10-01 MED ORDER — OXYTOCIN-SODIUM CHLORIDE 30-0.9 UT/500ML-% IV SOLN
4.0000 m[IU]/min | INTRAVENOUS | Status: DC
Start: 2017-10-01 — End: 2017-10-03

## 2017-10-01 NOTE — Addendum Note (Signed)
Addendum  created 10/01/17 1052 by Marlon Vonruden Logan, CRNA    Sign clinical note

## 2017-10-01 NOTE — Progress Notes (Incomplete)
Patient attempted to void again with no luck. Bladder scanner inconsistent results (anywhere from 300-600 in bladder). Patient does not feel like she has to void. Told to drink plenty of fluids and try to void again in 1 hour.

## 2017-10-01 NOTE — Progress Notes (Signed)
IV saline locked. SCD's off. Patietnt up to bathroom. Patient slow moving with assist, tolerated transfer to bathroom.  Peri care done. Changed mesh panties and peri pad. Changed bed pad. Patient up to sink to wash hands. Patient back in bed. Phone/call bell within reach. IVF reconnected and running. SCD's back on. Foley draining to gravity. Patient in no acute distress.

## 2017-10-01 NOTE — Anesthesia Postprocedure Evaluation (Signed)
EPIDURAL FOR LABOR/DELIVERY - DURAMORPH (C-SECTION)    Interval Hx: Kim Booker seen and examined. Anesthesia record reviewed. No anesthesia-related complications. No significant respiratory depression following administration of neuraxial, preservative-free morphine. All patient questions and concerns addressed.    PEx:  Visit Vitals  BP (!) 86/56   Pulse 79   Temp 36.7 C (98 F) (Oral)   Resp 18   Ht 1.6 m (5\' 3" )   Wt 67.6 kg (149 lb)   LMP 12/17/2016 (Exact Date)   SpO2 100%   Breastfeeding? Yes   BMI 26.39 kg/m     Alert and oriented; no acute distress.    Assessment and Plan:  Status post neuraxial anesthetic including use of preservative-free morphine, with no assessed or reported anesthetic complications.  All questions and concerns addressed. Patient satisfied with anesthetic care.  Signing off; reconsult PRN.      Manson Passey Palmyra Rogacki, CRNA  10/01/2017

## 2017-10-01 NOTE — Lactation Note (Signed)
LACTATION CONSULTATION    Date Time: 10/01/17 1:17 PM  Patient Name: Kim Booker,Kim Booker  OB/GYN: Zannie Kehr, MD   Pediatrician: [facility] following up with [facility]    Maternal Data:       Maternal Age: 41 y.o. ;  OB History     Gravida Para Term Preterm AB Living    1 1 1  0 0 1    SAB TAB Ectopic Multiple Live Births    0 0 0 0 1        OB History:     Delivery type:  Cesarean      Past Medical History:     Past Medical History:   Diagnosis Date   . AMA (advanced maternal age) multigravida 35+    . Asthma     frequent bronchitis in her country [place] but not asthma; better here       Past Surgical History:   History reviewed. No pertinent surgical history.    Family History:     Family History   Problem Relation Age of Onset   . Heart disease Father    . Hypertension Mother    . Breast cancer Neg Hx    . Ovarian cancer Neg Hx    . Colon cancer Neg Hx        Social History:     Social History     Social History   . Marital status: Married     Spouse name: N/A   . Number of children: N/A   . Years of education: N/A     Social History Main Topics   . Smoking status: Never Smoker   . Smokeless tobacco: Never Used   . Alcohol use No   . Drug use: No   . Sexual activity: Yes     Partners: Female     Other Topics Concern   . Not on file     Social History Narrative   . No narrative on file       Allergies:   No Known Allergies    Medications:     Prescriptions Prior to Admission   Medication Sig   . Docosahexaenoic Acid (DHA) 200 MG Cap Take by mouth.   . Prenatal Vit-Fe Fumarate-FA (PRENATAL PO) Take by mouth.         Infant Data:   Infant Name: MANAIA, SAMAD    DOB:  09/30/2015     7:43 PM    SEX: female   MR #: [id number]    Gestational Age at Birth: [redacted]w[redacted]d     APGAR 8 , 9  and    at 1, 5 and 10 minutes respectively.     Birth Weight: 4.11 kg (9 lb 1 oz)         Assessment:      10/01/17 1455   Lactation Consultation   Visit Type  Initial Assessment   Maternal Information   Breastfeeding Status Yes    Previous Breastfeeding Experience No   Taking meds incompatible with breastfeeding N   Storing milk  N   Pumping Experience N   Pumping this hospitalization N   Dumping Milk N   Breast Assessment   Left Breast Small;Medium   Left Nipple Evert   Right Breast Small;Medium   Right Nipple Evert   Equipment   Equipment Lanolin        10/01/17 1455   Lactation Consultation   Visit Type  Initial Assessment   Infant Assessment  General Skin Color Appropriate for ethnicity   Activity Quiet alert   Feeding Assessment   Infant Level of Arousal Easily aroused to stimulation   Feeding Positions Used Cross Cradle;Semi-reclining   Alignment Chin touching breast;Nose to nipple   Areolar Grasp Lips flanged   Areolar Compression Rhythmic;Long jaw movements   Suck/Swallow Coordinated;With encouragement   Feeding Supplements   Supplements No   Feeding Information   Feeding Tolerance Tolerates well  (with encouragement)     This is a first time mother who had breast changes in pregnancy. The baby is 79 hours old. Mother has not nursed since 1000 and has not attempted since 1. Baby was drowsy on lactations arrival. Encouraged skin to skin. Mom was shown  tactile stimulation/massage, Babkin reflex, and suck training to help awaken her baby. The baby was able to latch. Mother was shown breast compression and tactile stimulation to keep suck nutritive. Mother was able to use a cross cradle position and was shown laid back nursing which enabled her daughter to grasp a larger amount of the breast. Reminded mother to try and nurse the baby 8-12 times in 24 hours.  Introduced breastfeeding folder. Plan of care reviewed with mother and written on white board. Mother is aware that floor nurses will be able to help her with her breastfeeding needs until lactation returns tomorrow.  Reviewed plan of care with floor nurse.    Plan:     - Mother to nurse ad lib, on demand for 8-12  feedings a day.  - Mother to ensure that infant is actively  sucking at the breast, stimulating infant prn to keep sucking nutritive.  - Utilize skin to skin to help baby awaken and feed more actively.  - Lactation will follow up tomorrow.    Signed by: Linus Orn, RN, IBCLC, RLC

## 2017-10-01 NOTE — Progress Notes (Signed)
Impression:  POD#1 s/p C/S.  Doing well.    Plan:  Routine Post op care.  Advance diet and activity as tolerated.  D/C dressing and Foley.    S: Patient without complaints. Pain controlled with medications, lochia decreasing . Patient denies nausea/vomiting.     Objective:    Patient Vitals for the past 12 hrs:   BP Temp Pulse Resp   10/01/17 0801 (!) 86/56 98 F (36.7 C) 79 18   10/01/17 0540 108/74 98.9 F (37.2 C) 86 19   10/01/17 0345 96/69 98.7 F (37.1 C) (!) 101 16   09/30/17 2345 117/73 98.7 F (37.1 C) (!) 112 16   09/30/17 2230 118/60 - 99 16   09/30/17 2215 98/56 98.7 F (37.1 C) (!) 101 18   09/30/17 2200 99/63 - (!) 110 16   09/30/17 2145 102/62 - 85 16   09/30/17 2130 92/56 - 100 16   09/30/17 2115 92/55 - - 16     UO:   Fundus--firm, appropriately tender.  Dressing clean, dry, intact.  Ext--NT, no cyanosis, clubbing or edema, SCDs on.  Lochia--decreasing.    Lab Results   Component Value Date    WBC 16.89 (H) 10/01/2017    HGB 10.4 (L) 10/01/2017    HCT 31.0 (L) 10/01/2017    PLT 182 10/01/2017    TSH 0.82 03/25/2017    HGBA1C 5.2 03/25/2017

## 2017-10-01 NOTE — Progress Notes (Signed)
Patient admitted to postpartum.  Settled in bed.  IV with Pitocin 30 units/500 LR infusing in right forearm.  No s/sx of infiltration.  VSS.  Admission assessment completed and documented.  Reviewed postpartum folder and breastfeeding folder.  Reviewed/demonstrated use of white board/call bell/phone.  Call bell/phone within reach of patient.  Patient encouraged to call with needs.  Reviewed visiting hours and picture options.   Instructed to call nurse the first two times out of bed for safety, instructed first attempt to get out of bed will be approximately 6 hours after delivery if patient is stable.

## 2017-10-01 NOTE — Progress Notes (Signed)
Assessed patient for ability to ambulate per protocol. Pt stated c/o dizziness; encouraged patient to drink fluids and will reassess. Will continue to monitor.

## 2017-10-01 NOTE — Progress Notes (Signed)
Patient assisted out of bed for the second time. Patient denies dizziness and able to walk to bathroom without difficulty. SEDs now left off since patient able to get out of bed without difficulty.

## 2017-10-01 NOTE — Progress Notes (Signed)
Pt assessed, pt stable at this time and in no distress. Patient denies pain and nausea. Complaining of occasional dizziness and BP low. Reported to Dr. Arvilla Market. SEDs remain on and IV fluid infusing. White board updated and plan of care reviewed. Pt given opportunity to ask questions. No questions or concerns at this time. Will monitor.

## 2017-10-02 ENCOUNTER — Encounter: Payer: Self-pay | Admitting: Obstetrics & Gynecology

## 2017-10-02 NOTE — Lactation Note (Signed)
10/02/17 1205   Lactation Consultation   Visit Type  Follow Up   Maternal Information   Breastfeeding Status Yes   Previous Breastfeeding Experience No   Taking med's incompatible with breastfeeding N   Storing milk  N   Pumping Experience N   Pumping this hospitalization N  (aware to pump for poor or painful feedings/or supplementation)   Breast Assessment   Left Breast Medium   Left Nipple Medium;Short   Right Breast Medium   Right Nipple Medium;Short      10/02/17 1205   Lactation Consultation   Visit Type  Follow Up   Infant Assessment   General Skin Color Appropriate for ethnicity   Activity Active with stimulation   Feeding Assessment   Infant Level of Arousal Awake;Easily aroused to stimulation   Feeding Positions Used Cross Cradle   Alignment Straight line at breast level;Nose to nipple;Chin touching breast   Areolar Grasp Lips flanged   Areolar Compression Rhythmic   Suck/Swallow Coordinated;With encouragement   Feeding Supplements   Supplements No   Feeding Information   Feeding Tolerance Tolerates well     mother states baby is breastfeeding well. She denies any nipple pain. Infant feeding assessment see above. encouraged use of breast compression to keep baby actively feeding.  Discussed pumping for poor/painful feeding or if formula supplementation is needed for medical reason.    Plan; feed per infant feeding cue 8 + times per 24 hours.  Use breast compression to keep baby actively feeding,   Pump for poor feedings or if infant is not latching.  Lactation will follow up in am.

## 2017-10-02 NOTE — Progress Notes (Signed)
Patient still unable to void and noted bladder to be more distended.Explained to her that her bladder needs to be emptied by straight cath, verbalized understanding.This RN decided to put a foley instead because she noted that the bladder is really distended. Foley was inserted at this time and was able to drain 1700 ml urine. Dr. Arvilla Market was informed and ordered to leave the foley in for the night.

## 2017-10-02 NOTE — Addendum Note (Signed)
Addendum  created 10/02/17 1655 by Rayburn Ma, MD    Anesthesia Event edited

## 2017-10-02 NOTE — Progress Notes (Signed)
Bladder noted to be distended on assessment. Has not voided post foley removal at 1400 per report. Assisted to bathroom and voided 200 ml urine. Refused to be catheterized for now,states she wanted to still try as ahe feels "it's almost there". Assisted to shower.

## 2017-10-02 NOTE — Progress Notes (Signed)
Patient without complaints.  Had urinary retention overnight requiring Foley placement.  Afebrile, Vital signs stable.    Objective:  Patient Vitals for the past 12 hrs:   BP Temp Pulse Resp   10/02/17 0552 102/57 - 80 18   10/02/17 0534 (!) 88/50 98 F (36.7 C) 83 16   10/01/17 2324 107/61 98.4 F (36.9 C) 95 17         Abdomen--Fundus firm appropriately tender.  Wound clean, dry and intact.  Ext--NT, no cyanosis, clubbing, or edema.  Lochia--decreasing.  Lab Results   Component Value Date    WBC 16.89 (H) 10/01/2017    HGB 10.4 (L) 10/01/2017    HCT 31.0 (L) 10/01/2017    MCV 92.0 10/01/2017    PLT 182 10/01/2017         Impression:  POD#2 s/p C/S.  Urinary retention.  Otherwise doing well.    Plan:  Routine postop care.  Anticipate discharge home tomorrow.

## 2017-10-02 NOTE — Plan of Care (Signed)
Problem: Vaginal/Cesarean Delivery  Goal: Postpartum management of pain/discomfort  Outcome: Progressing  Goal: Breasts are soft with nipple integrity intact  Outcome: Progressing  Goal: Gastrointestinal/Urinary management  Outcome: Progressing  Goal: Uterine management  Outcome: Progressing  Goal: Perineum will be clean, dry, and intact and without discharge or hematoma  Outcome: Progressing  Goal: Evidence of positive mother-baby interactions  Outcome: Progressing

## 2017-10-03 MED ORDER — OXYCODONE-ACETAMINOPHEN 5-325 MG PO TABS
2.0000 | ORAL_TABLET | Freq: Four times a day (QID) | ORAL | 0 refills | Status: DC | PRN
Start: 2017-10-03 — End: 2018-04-16

## 2017-10-03 NOTE — Lactation Note (Signed)
10/03/17 1000   Lactation Consultation   Visit Type  Follow Up   Maternal Information   Breastfeeding Status Yes   Previous Breastfeeding Experience No   Taking meds incompatible with breastfeeding N   Storing milk  N   Pumping Experience N   Pumping this hospitalization Y  25mm flanges; Discussed proper flange fit   Dumping Milk N   Breast Assessment   Left Breast Medium;Filling   Left Nipple Medium;Evert   Right Breast Medium;Filling   Right Nipple Medium;Geophysical data processor grade pump;Parents declined a HGP rental;Lanolin; Has a Evenflow double pump at home; Dad states the pump has 25 and 28mm flanges; Encouraged mom to move up the the 28mm flange if her nipple was rubbing and to read all instructions        10/03/17 1000   Lactation Consultation   Visit Type  Follow Up   Infant Assessment   Voids in Past 24 hours 3   Stools in Past 24 hours 4+   Stool Color Green   General Skin Color Appropriate for ethnicity;Pink   Activity Active with stimulation   Phototherapy No   Feeding Assessment   Infant Level of Arousal Easily aroused to stimulation   Feeding Positions Used [employer];Football   Alignment Nose to nipple;Chin touching breast   Areolar Grasp Lips flanged;Asymmetric latch   Areolar Compression Long jaw movements  With encouragement   Suck/Swallow Coordinated   Feeding Supplements   Supplements Yes;Medically Indicated   Type Formula; Breast milk when available   Route Bottle, slow flow nipple   Frequency At each feeding   Amount per feeding 15-41mls, discussed that baby make take less if she has BF well   OTHER   Oral Assessment (WDL) WDL   Feeding Information   Feeding Tolerance Tolerates well     Met with mom for follow-up assessment and discharge instructions. Observed baby taking long draws at the breast. Mom is able to identify a nutritive suck. Encouraged her to switch to the 2nd breast when baby no longer showing a nutritive suck pattern and switch back to the original breast  if baby still showing signs of hunger. Reviewed the Breastfeeding folder including signs of adequate intake, appropriate supplementation amounts and engorgement care. Discussed a feeding plan and parents were given the opportunity to ask questions. They have a printed copy of the feeding plan as well as the Lactation department contact information. Parents were encouraged to call with any concerns and schedule a follow-up assessment when baby is 42-17 days old.    Plan:  Goals: 1) Protect nipples, 2) Feed baby, 3) Protect milk supply    Feed baby at early signs of hunger every 2-3 hours.  Utilize skin to skin to arouse baby.  Assure a deep comfortable latch, flange lips and use compression to keep the feeding active.  Offer baby a supplement after breastfeeding per Pediatrician.  Monitor wet and dirty diapers for signs of adequate intake.  Follow-up with LC when baby is 46-69 days old.

## 2017-10-03 NOTE — Progress Notes (Signed)
Pt d/c to home with infant.  Pt given discharge instructions, follow up appointment to be scheduled for 2 and 6 weeks with OB, medication information reviewed and prescriptions given to pt. Pt given opportunity to ask questions. All questions answered.  Patient has no LDA's at time of discharge.

## 2017-10-03 NOTE — Discharge Summary (Signed)
Obstetrics Discharge Summary    Delivery:  See Delivery Record    Puerperal Course: Normal    Discharge Examination: Normal    Diagnosis:  S/p primary c-section    Instructions:    Pt has been given a discharge instruction sheet which includes diet & activity  See medication reconciliation sheet  rtc in 2 and 6 weeks if had c-section otherwise rtc 6 weeks  .

## 2017-10-03 NOTE — Discharge Instr - AVS First Page (Signed)
Reason for your Hospital Admission:  delivery      Instructions for after your discharge:  After Your Delivery Discharge Instructions    After Discharge Appointments: 2 and 6 week postpartum appointments    Current Discharge Medication List      START taking these medications    Details   oxyCODONE-acetaminophen (PERCOCET) 5-325 MG per tablet Take 2 tablets by mouth every 6 (six) hours as needed for Pain.  Qty: 20 tablet, Refills: 0         CONTINUE these medications which have NOT CHANGED    Details   Docosahexaenoic Acid (DHA) 200 MG Cap Take by mouth.      Prenatal Vit-Fe Fumarate-FA (PRENATAL PO) Take by mouth.            Medical equipment: none     Call the Physician with any C-Section signs and symptoms:    Warning signs regarding incision:   "Popping" of stitches or staples   Foul smelling discharge or pus   Redness or streaks around incision    Incision care:   Keep incision uncovered   No tub baths for 2 weeks     After your delivery - signs and symptoms to watch for:   Fever - Oral temperature greater than 100.4 degrees Fahrenheit   Foul-smelling vaginal discharge   Headache unrelieved by over the counter medications   Difficulty urinating   Breasts reddened, hard, hot to the touch   Nipple discharge which is foul-smelling or contains pus   Increased pain at the site of the surgical incision   Difficulty breathing with or without chest pain   New calf pain especially if only on one side   Sudden, continuing increased vaginal bleeding with or without clots   Unrelieved feelings of:   Inability to cope   Sadness   Anxiety   Lack of interest in baby   Insomnia   Crying     What to do at home:   See patient education handouts for full information   Resume activity gradually    Don't lift anything heavier than baby and carrier until OK'd by your Physician    No sex until OK'd by your Physician    Take care of yourself by sleeping/resting as much as possible   Eat regular nutritious  meals   Let someone else care for you, your baby, and housework as much as possible    Take pain medication as prescribed whenever you need them   To avoid/relieve constipation take stool softeners if advised    Drink lots of water/fruit juices   Increase fiber in your diet   Breast care: Wear support bra 24/7; use lanolin ointment/cream as needed     Refer to Newborn Discharge Instructions for problems or follow-up regarding  nursing

## 2017-10-03 NOTE — Plan of Care (Signed)
Problem: Vaginal/Cesarean Delivery  Goal: Postpartum management of pain/discomfort  Outcome: Progressing   10/03/17 0956   Goal/Interventions addressed this shift   Postpartum management of pain/discomfort Assess pain using a consistent, developmental/age appropriate pain scale;Assess pain level before and following intervention;Include patient/patient care companion in decisions related to pain management;Monitor for post anesthesia issues related to pain management;Report ineffective pain management to LIP;Offer non-pharmacologic pain management interventions     Goal: Breasts are soft with nipple integrity intact  Outcome: Progressing   10/03/17 0956   Goal/Interventions addressed this shift   Breasts are soft with nipple integrity intact Perform breast/nipple assessment;Assess and manage engorgement;Breastfeed and/or pump breasts at least 8-12 times within 24 hours;Provide pharmacologic and non-pharmacologic interventions as needed;Ensure proper positioning and latch     Goal: Uterine management  Outcome: Progressing   10/03/17 0956   Goal/Interventions addressed this shift   Uterine management Assess fundus and notify LIP if not firm, midline, or at or below the umbilicus, or if abdomen is abnormally distended;Assess for hemorrhage risk using appropriate screening tool     Goal: Evidence of positive mother-baby interactions  Outcome: Progressing   10/03/17 0956   Goal/Interventions addressed this shift   Evidence of positive mother-baby interactions Include patient/patient care companion in decisions related to care;Initiate safety and falls prevention interventions;Encourage rooming in and infant feeding on [employer] parent/caregiver engagement and awareness of infant cues/behavior;Notify LIP and case management if risk factors are identified

## 2017-10-10 NOTE — Addendum Note (Signed)
Addendum  created 10/10/17 1635 by Rayburn Ma, MD    Anesthesia Staff edited

## 2018-01-03 ENCOUNTER — Encounter (INDEPENDENT_AMBULATORY_CARE_PROVIDER_SITE_OTHER): Payer: Self-pay | Admitting: Obstetrics and Gynecology

## 2018-03-23 ENCOUNTER — Emergency Department: Payer: No Typology Code available for payment source

## 2018-03-23 ENCOUNTER — Emergency Department
Admission: EM | Admit: 2018-03-23 | Discharge: 2018-03-23 | Disposition: A | Discharge status: Home / Self Care | Payer: No Typology Code available for payment source | Attending: Emergency Medicine | Admitting: Emergency Medicine

## 2018-03-23 DIAGNOSIS — J029 Acute pharyngitis, unspecified: Secondary | ICD-10-CM | POA: Insufficient documentation

## 2018-03-23 DIAGNOSIS — R0982 Postnasal drip: Secondary | ICD-10-CM | POA: Insufficient documentation

## 2018-03-23 LAB — GROUP A STREP, RAPID ANTIGEN: Group A Strep, Rapid Antigen: NEGATIVE

## 2018-03-23 NOTE — Discharge Instructions (Signed)
Pharyngitis    You have been diagnosed with pharyngitis.    Pharyngitis is an infection of the back of your throat. Most sore throats are caused by viruses and do not require antibiotics. Some sore throats are caused by bacteria. Antibiotics will help this type of sore throat. A test for Strep throat may be used to help in your diagnosis.    Symptoms of pharyngitis include fever (temperature higher than 100.4F / 38C), sore throat, and a hoarse voice. If you have cold symptoms such as sneezing and coughing, runny nose, or congestion, your sore throat is more likely to be caused by a virus and not bacteria.    Whether your sore throat is caused by a virus or bacteria, you may need medication for pain and fever. You should also drink a lot of fluid. If your sore throat is caused by bacteria, you will also need antibiotics. If your sore throat is caused by a virus, you do not need antibiotics. Antibiotics will not kill the virus and they may cause side-effects, like diarrhea, abdominal cramps, or allergic reactions. Taking an antibiotic that you do not need may cause "resistance," meaning that antibiotic won't work in the future when you have a true bacterial infection.    YOU SHOULD SEEK MEDICAL ATTENTION IMMEDIATELY, EITHER HERE OR AT THE NEAREST EMERGENCY DEPARTMENT, IF ANY OF THE FOLLOWING OCCURS:   You have difficulty breathing.   Your voices changes or seems muffled.   You have trouble swallowing.   You have a fever (temperature higher than 100.4F / 38C) that won't go away.   You feel worse or do not improve after 2 to 3 days.             Nasal Congestion    You have been seen for nasal congestion.    Nasal congestion is also called a "stuffy nose." Some people feel congested from having mucus in the nose. This can happen when people have colds, for example. However, true congestion is when membranes lining the nose get swollen and inflamed. Nasal congestion can have many causes. Some are colds,  sinus infections, allergies and other irritants like pollution or dry air. Using nasal sprays or drops too much may also cause congestion.    Congestion may cause ear problems like pain or pressure, trouble breathing or trouble sleeping. Mostly, a stuffy nose is just annoying!    Most nasal congestion is because of a virus and goes away with time. Some other ways to help symptoms are:   Elevate (lift) your head while sleeping. Congestion is often worse when lying down.   Keep nostrils moist with a saline (salt water) nasal spray.   Increase air humidity with a humidifier or vaporizer.   Drink plenty of fluids to stay well-hydrated.    Some other congestion treatments are decongestants and antihistamines. Never use decongestant sprays and drops for more than 3 days. They can actually make congestion worse! Many antihistamines may also make you sleepy. Never drive a car or operate machinery while taking these medicines.    You have been given a prescription for your nasal congestion. Use the medicine as prescribed.    YOU SHOULD SEEK MEDICAL ATTENTION IMMEDIATELY, EITHER HERE OR AT THE NEAREST EMERGENCY DEPARTMENT, IF ANY OF THE FOLLOWING OCCURS:   Symptoms get worse or do not get better after 2 weeks.   Swelling of the face or around the eyes develops   Blurry vision (seeing) or changes in vision.     Worsening pain of the face or severe headaches.   You have trouble breathing.   Fever (temperature higher than 100.37F / 38C).   Any other concerns.     Rest, fluids.  Tylenol or ibuprofen for pain, fever.    Consider use of antihistamine, like claritin, zyrtec or allegra, for symptom relief.  Activity as tolerated.        Return to ED for nausea, vomiting, fevers, chills, chest pain, shortness of breath, abdominal pain, headaches, neck pain, back pain, weakness, numbness, worsening pain, worsening symptoms, unable to follow up with PMD/Specialist as directed

## 2018-03-26 NOTE — ED Provider Notes (Signed)
Physician/Midlevel provider first contact with patient: 03/23/18 1340         History     Chief Complaint   Patient presents with   . Hemoptysis       Chief Complaint:  Blood-tinged sputum  Location:  Upper respiratory tract  Onset:  Day of presentation  Character:  Blood-tinged  Aggravating/Alleviating Factors:  .  Associated cough that is productive of mucus that is blood-tinged, fatigue, nasal congestion, postnasal drip, cough.  No symptom treatment.  No alleviating or aggravating factors  Timing:  Episodic  Environment:  Home  Severity:  Mild  Context:  Patient, alone to ED, for evaluation of blood-tinged mucus.  She states this morning she was clearing her throat and produced sputum that was blood-tinged.  She states she also feels fatigued.  Patient did not try to treat herself with anyway.  She presents for evaluation.    she reports having similar symptoms approximately 2 years ago.  She had a chest x-ray that was unremarkable.  She was treated for antibiotics and given a 6 month course of treatment for tuberculosis.  She does report having history of bacille Calmette-Guerin vaccination as she is from [place].  He has previously been evaluated by pulmonologist, Dr. Sharma Covert    PMD    pulm dubey        The history is provided by the patient and medical records. No language interpreter was used.            Past Medical History:   Diagnosis Date   . AMA (advanced maternal age) multigravida 35+    . Asthma     frequent bronchitis in her country [place] but not asthma; better here       Past Surgical History:   Procedure Laterality Date   . CESAREAN SECTION N/A 09/30/2017    Procedure: CESAREAN SECTION;  Surgeon: Buena Irish, MD;  Location: [facility];  Service: Gynecology;  Laterality: N/A;       Family History   Problem Relation Age of Onset   . Heart disease Father    . Hypertension Mother    . Breast cancer Neg Hx    . Ovarian cancer Neg Hx    . Colon cancer Neg Hx        Social  Social History    Substance Use Topics   . Smoking status: Never Smoker   . Smokeless tobacco: Never Used   . Alcohol use No       .     No Known Allergies    Home Medications     Med List Status:  In Progress Set By: Marvia Pickles, RN at 03/23/2018  1:34 PM                Prenatal Vit-Fe Fumarate-FA (PRENATAL PO)     Take by mouth.           Flagged for Removal             Docosahexaenoic Acid (DHA) 200 MG Cap     Take by mouth.     oxyCODONE-acetaminophen (PERCOCET) 5-325 MG per tablet     Take 2 tablets by mouth every 6 (six) hours as needed for Pain.           Review of Systems   Constitutional: Positive for fatigue. Negative for activity change, chills, diaphoresis, fever and unexpected weight change.   HENT: Positive for congestion, postnasal drip and sore throat. Negative for rhinorrhea,  sinus pain, trouble swallowing and voice change.    Eyes: Negative for pain, discharge and redness.   Respiratory: Positive for cough. Negative for chest tightness, shortness of breath and wheezing.    Cardiovascular: Negative for chest pain, palpitations and leg swelling.   Gastrointestinal: Negative for abdominal pain, diarrhea, nausea and vomiting.   Endocrine: Negative for polydipsia, polyphagia and polyuria.   Genitourinary: Negative for dysuria, frequency, urgency, vaginal bleeding and vaginal discharge.        Denies pregnancy   Musculoskeletal: Negative for arthralgias, back pain, myalgias and neck pain.   Skin: Negative for color change, rash and wound.   Allergic/Immunologic: Negative for environmental allergies, food allergies and immunocompromised state.   Neurological: Negative for dizziness, tremors, syncope, weakness, light-headedness, numbness and headaches.   Hematological: Negative for adenopathy. Does not bruise/bleed easily.   Psychiatric/Behavioral: Negative for agitation and confusion. The patient is not nervous/anxious and is not hyperactive.    All other systems reviewed and are negative.      Physical Exam    BP:  103/67, Heart Rate: 71, Temp: 97 F (36.1 C), Resp Rate: 16, SpO2: 99 %, Weight: 60.8 kg    Physical Exam   Constitutional: She is oriented to person, place, and time. She appears well-developed and well-nourished. She is cooperative.  Non-toxic appearance. She does not appear ill. No distress.   HENT:   Head: Normocephalic and atraumatic.   Right Ear: External ear normal.   Left Ear: External ear normal.   Nose: Nose normal. No mucosal edema or rhinorrhea. No epistaxis. Right sinus exhibits no maxillary sinus tenderness and no frontal sinus tenderness. Left sinus exhibits no maxillary sinus tenderness and no frontal sinus tenderness.       Mouth/Throat: Oropharynx is clear and moist. No oropharyngeal exudate, posterior oropharyngeal edema, posterior oropharyngeal erythema or tonsillar abscesses.   Eyes: Pupils are equal, round, and reactive to light. Conjunctivae and lids are normal. Right eye exhibits no discharge. Left eye exhibits no discharge. Right conjunctiva is not injected. Left conjunctiva is not injected. No scleral icterus.   Neck: Normal range of motion. Neck supple. No thyromegaly present.   Cardiovascular: Normal rate, regular rhythm, normal heart sounds and intact distal pulses.    No murmur heard.  Pulses:       Radial pulses are 2+ on the right side.   Pulmonary/Chest: Effort normal and breath sounds normal. No respiratory distress. She has no wheezes. She has no rales. She exhibits no tenderness.   Abdominal: Soft. Bowel sounds are normal. She exhibits no distension, no abdominal bruit, no ascites, no pulsatile midline mass and no mass. There is no hepatosplenomegaly. There is no tenderness. There is no rigidity, no rebound, no guarding and no CVA tenderness. No hernia.   Musculoskeletal: Normal range of motion. She exhibits no edema or tenderness.   Lymphadenopathy:     She has no cervical adenopathy.   Neurological: She is alert and oriented to person, place, and time. No sensory deficit. She  exhibits normal muscle tone. Coordination and gait normal.   Skin: Skin is warm and dry. Capillary refill takes less than 2 seconds. No abrasion, no ecchymosis, no laceration, no lesion, no petechiae and no rash noted. She is not diaphoretic. No erythema. Nails show no clubbing.   Psychiatric: She has a normal mood and affect. Her speech is normal and behavior is normal. Judgment and thought content normal. Cognition and memory are normal.   Nursing note and vitals reviewed.  MDM and ED Course     ED Medication Orders     None             MDM  Number of Diagnoses or Management Options  Pharyngitis, unspecified etiology: new and requires workup  Post-nasal drip: new and requires workup  Diagnosis management comments: Plan: Labs, imaging, supportive care, reassessment.    Labs and imaging results reviewed.  No acute findings.  No further workup warranted at this time.    Patient's questions have been answered.  She voices understanding follow-up instructions.  Trichomoniasis return precautions advised.  She is ready for discharge.    The attending signature signifies review and agreement of the history , PE, evaluation, clinical impression and discharge plan except as otherwise noted.    I,Belle Charlie, PA-C, have been the primary provider for Kim Booker during this Emergency Dept visit.    Oxygen saturation by pulse oximetry is 95%-100%, Normal.  Interventions: None Needed.    DDX to include, but not limited to:  Upper respiratory infection, tuberculosis, pneumonia, less likely, anemia, blood dyscrasia  Plan:  Supportive care, over-the-counter medications, PMD referral    Results     Procedure Component Value Units Date/Time    Rapid Strep (Group A Antigen) ([id number]) Collected:  03/23/18 1411    Specimen:  Throat Updated:  03/23/18 1425     Group A Strep, Rapid Antigen Negative        Radiology Results (24 Hour)     Procedure Component Value Units Date/Time    Chest AP Portable ([id number])  Collected:  03/23/18 1358    Order Status:  Completed Updated:  03/23/18 1402    Narrative:       Clinical History: Hemoptysis.    Findings: AP view of the chest. No prior studies are available for  comparison.     Central silhouette and pulmonary vascularity are within normal limits  for AP technique.     No focal consolidation.      Impression:        No evidence of acute cardiopulmonary disease.    Darra Lis, MD   03/23/2018 1:58 PM        I have reviewed all labs and/or radiological studies. I have reviewed all xrays if any myself on the PACS system.      " *This note was generated by the Epic EMR system/ Dragon speech recognition and   may contain inherent errors or omissions not intended by the user. Grammatical    errors, random word insertions, deletions, pronoun errors and incomplete   sentences are occasional consequences of this technology due to software   limitations. Not all errors are caught or corrected. If there are questions or    concerns about the content of this note or information contained within the body   of this dictation they should be addressed directly with the author for   Clarification."       Amount and/or Complexity of Data Reviewed  Clinical lab tests: ordered and reviewed  Tests in the radiology section of CPT: ordered and reviewed (  Chest AP Portable (Final result)   Result time 03/23/18 13:58:50   Final result by Betti Cruz, MD (03/23/18 13:58:50)             Impression:     No evidence of acute cardiopulmonary disease.    Darra Lis, MD   03/23/2018 1:58 PM        Narrative:  Clinical History: Hemoptysis.    Findings: AP view of the chest. No prior studies are available for  comparison.     Central silhouette and pulmonary vascularity are within normal limits  for AP technique.     No focal consolidation.          )  Tests in the medicine section of CPT: ordered and reviewed  Review and summarize past medical records: yes  Discuss the  patient with other providers: yes (ED case d/w attending, mehta.s, who examined the patient and agrees with the current course, tx, and disposition plan.)  Independent visualization of images, tracings, or specimens: yes    Risk of Complications, Morbidity, and/or Mortality  Presenting problems: high  Diagnostic procedures: high  Management options: high    Patient Progress  Patient progress: stable                   Procedures    Clinical Impression & Disposition     Clinical Impression  Final diagnoses:   Pharyngitis, unspecified etiology   Post-nasal drip        ED Disposition     ED Disposition Condition Date/Time Comment    Discharge  Sun Mar 23, 2018  2:47 PM Jadence Booker discharge to home/self care.    Condition at disposition: Stable           Discharge Medication List as of 03/23/2018  2:47 PM                    Janylah Belgrave, Selinda Flavin, PA  03/26/18 1316

## 2018-04-16 ENCOUNTER — Ambulatory Visit (INDEPENDENT_AMBULATORY_CARE_PROVIDER_SITE_OTHER): Payer: No Typology Code available for payment source | Admitting: Family Medicine

## 2018-04-16 ENCOUNTER — Encounter (INDEPENDENT_AMBULATORY_CARE_PROVIDER_SITE_OTHER): Payer: Self-pay | Admitting: Family Medicine

## 2018-04-16 VITALS — BP 91/60 | HR 80 | Temp 98.5°F | Ht 61.75 in | Wt 129.0 lb

## 2018-04-16 DIAGNOSIS — R042 Hemoptysis: Secondary | ICD-10-CM

## 2018-04-16 DIAGNOSIS — K922 Gastrointestinal hemorrhage, unspecified: Secondary | ICD-10-CM

## 2018-04-16 DIAGNOSIS — J309 Allergic rhinitis, unspecified: Secondary | ICD-10-CM

## 2018-04-16 DIAGNOSIS — Z111 Encounter for screening for respiratory tuberculosis: Secondary | ICD-10-CM

## 2018-04-16 LAB — CBC AND DIFFERENTIAL
Absolute NRBC: 0 10*3/uL (ref 0.00–0.00)
Basophils Absolute Automated: 0.05 10*3/uL (ref 0.00–0.08)
Basophils Automated: 1.1 %
Eosinophils Absolute Automated: 0.21 10*3/uL (ref 0.00–0.44)
Eosinophils Automated: 4.5 %
Hematocrit: 38.7 % (ref 34.7–43.7)
Hgb: 12.4 g/dL (ref 11.4–14.8)
Immature Granulocytes Absolute: 0 10*3/uL (ref 0.00–0.07)
Immature Granulocytes: 0 %
Lymphocytes Absolute Automated: 1.95 10*3/uL (ref 0.42–3.22)
Lymphocytes Automated: 41.5 %
MCH: 28.8 pg (ref 25.1–33.5)
MCHC: 32 g/dL (ref 31.5–35.8)
MCV: 90 fL (ref 78.0–96.0)
MPV: 9.6 fL (ref 8.9–12.5)
Monocytes Absolute Automated: 0.4 10*3/uL (ref 0.21–0.85)
Monocytes: 8.5 %
Neutrophils Absolute: 2.09 10*3/uL (ref 1.10–6.33)
Neutrophils: 44.4 %
Nucleated RBC: 0 /100 WBC (ref 0.0–0.0)
Platelets: 259 10*3/uL (ref 142–346)
RBC: 4.3 10*6/uL (ref 3.90–5.10)
RDW: 14 % (ref 11–15)
WBC: 4.7 10*3/uL (ref 3.10–9.50)

## 2018-04-17 ENCOUNTER — Encounter (INDEPENDENT_AMBULATORY_CARE_PROVIDER_SITE_OTHER): Payer: Self-pay | Admitting: Family Medicine

## 2018-04-17 LAB — HEMOLYSIS INDEX: Hemolysis Index: 6 (ref 0–18)

## 2018-04-17 LAB — COMPREHENSIVE METABOLIC PANEL
ALT: 15 U/L (ref 0–55)
AST (SGOT): 19 U/L (ref 5–34)
Albumin/Globulin Ratio: 1.2 (ref 0.9–2.2)
Albumin: 4 g/dL (ref 3.5–5.0)
Alkaline Phosphatase: 107 U/L — ABNORMAL HIGH (ref 37–106)
BUN: 17 mg/dL (ref 7.0–19.0)
Bilirubin, Total: 0.3 mg/dL (ref 0.2–1.2)
CO2: 22 mEq/L (ref 21–29)
Calcium: 9.8 mg/dL (ref 8.5–10.5)
Chloride: 106 mEq/L (ref 100–111)
Creatinine: 0.7 mg/dL (ref 0.4–1.5)
Globulin: 3.3 g/dL (ref 2.0–3.7)
Glucose: 84 mg/dL (ref 70–100)
Potassium: 4.4 mEq/L (ref 3.5–5.1)
Protein, Total: 7.3 g/dL (ref 6.0–8.3)
Sodium: 140 mEq/L (ref 136–145)

## 2018-04-17 LAB — GFR: EGFR: >60

## 2018-04-19 ENCOUNTER — Encounter (INDEPENDENT_AMBULATORY_CARE_PROVIDER_SITE_OTHER): Payer: Self-pay | Admitting: Family Medicine

## 2018-04-19 LAB — QUANTIFERON(R)-TB GOLD PLUS
Mitogen-NIL: 6.64
NIL: 0.35
Quantiferon TB Gold Plus: POSITIVE — AB
TB1-NIL: 6.33
TB2-NIL: 6.51

## 2018-04-19 NOTE — Progress Notes (Signed)
Subjective:      Date: 04/16/2018 1:47 PM   Patient ID: Kim Booker is a 42 y.o. female.    Chief Complaint:  Chief Complaint   Patient presents with   . Establish Care     blood in sputum; hx of being evaluated and went to ED in April and Xray negative; patient states she feels a blockage in her throat and clears her throat often and will see blood tingned sputum with clots noted       HPI:  Patient presents with c/o spitting blood intermittently. Problems first appeared in year 2000 when she used to live in [place] (she moved here 6 - 7 yrs ago). She had small amount of blood that came out after a cough - this was a one time episode and was not evaluated by a doctor. The symptoms appeared again in 2013 and this time lasted for 3 days; she did not visit any doctor for this problem. Since then was fine till two months ago when she spitted blood off and on for 1 week and went to see a pulmonologist, Dr. Sharma Covert who diagnosed her with pneumonia. She was treated with antibiotics and an x-ray afterwards was normal. The bleeding stopped for few weeks and then reappeared. Patient was then seen in ED about a month ago where a CXR was normal. She was told to go back to pulmonologist which she did not because the "doctor had previously said she didn't need to see him again". Someone mentioned to her that this could be allergies so she put herself on claritin daily which has not made any difference. She is continuing to spit blood intermittently; symptoms have now worsened - ore frequent and lot more blood comes out when she spits. She showed pictures of what she is spitting - appears to be chunks of clotted blood - a teaspoon or so with each episode.  Patient describes feeling slight irritation in throat, followed by spitting of blood. There is no persistent cough. The episodes have been occurring a few times a day.  She feels fine otherwise except fatigue which she relates to taking care of baby. Denies cough,, SOB,  fever, weakness. Denies abdominal pain, N/V. Appetite normal but she does forget to eat sometimes when busy with the baby.    H/o tuberculosis - diagnosed in 2006 while in [place]. Per patient she completed treatment and cleared from TB.    She denies symptoms of heart burn; denies alcohol intake. Says she has had an upper endoscopy in [place] in 2010.      History reviewed in detail as written below.    Past Medical History:   Diagnosis Date   . AMA (advanced maternal age) multigravida 35+    . Bronchitis     frequent bronchitis in her country [place] but not asthma; better here       Past Surgical History:   Procedure Laterality Date   . CESAREAN SECTION N/A 09/30/2017    Procedure: CESAREAN SECTION;  Surgeon: Buena Irish, MD;  Location: [facility];  Service: Gynecology;  Laterality: N/A;       Social History     Social History   . Marital status: Married     Spouse name: N/A   . Number of children: N/A   . Years of education: N/A     Occupational History   . Not on file.     Social History Main Topics   . Smoking status: Never Smoker   .  Smokeless tobacco: Never Used   . Alcohol use No   . Drug use: No   . Sexual activity: Yes     Partners: Female     Other Topics Concern   . Not on file     Social History Narrative    Exercise no    Special diet no    Caffeine no           Family History   Problem Relation Age of Onset   . Heart disease Father         died from heart attack at age 49   . Hypertension Mother    . Stroke Mother         died at age 93   . Breast cancer Neg Hx    . Ovarian cancer Neg Hx    . Colon cancer Neg Hx        No Known Allergies    Current Outpatient Prescriptions   Medication Sig Dispense Refill   . loratadine (CLARITIN) 10 MG tablet Take 10 mg by mouth daily     . Prenatal Vit-Fe Fumarate-FA (PRENATAL PO) Take by mouth.       No current facility-administered medications for this visit.        Vitals:  Vitals:    04/16/18 1507   BP: 91/60   Pulse: 80   Temp: 98.5 F (36.9  C)   Weight: 58.5 kg (129 lb)   Height: 1.568 m (5' 1.75")         ROS:  Review of Systems   Constitutional: Positive for fatigue. Negative for activity change, appetite change, chills, diaphoresis, fever and unexpected weight change.   HENT: Positive for congestion (mild off and on). Negative for sinus pain and sinus pressure.    Respiratory: Negative.  Cough: denies cough.    Cardiovascular: Negative.    Gastrointestinal: Negative for abdominal pain, blood in stool, constipation, diarrhea, nausea and vomiting.   Genitourinary: Negative.    Musculoskeletal: Negative.    Skin: Negative for rash.   Neurological: Negative.    Psychiatric/Behavioral: Negative.            Objective:     Physical Exam   Constitutional: She appears well-developed.   HENT:   Nose: Mucosal edema (pale) present.   Mouth/Throat: Oropharynx is clear and moist and mucous membranes are normal. No oropharyngeal exudate or posterior oropharyngeal erythema.   Cardiovascular: Normal rate, regular rhythm and normal heart sounds.    No murmur heard.  Pulmonary/Chest: Effort normal and breath sounds normal. No respiratory distress. She has no wheezes. She has no rales.   Abdominal: Soft. She exhibits no distension and no mass. There is no tenderness.   Lymphadenopathy:     She has no cervical adenopathy.     She has no axillary adenopathy.   Neurological: She is alert.   Psychiatric: She has a normal mood and affect.   Vitals reviewed.         Assessment/Plan:       1. Hemoptysis  - CBC and differential  - Comprehensive metabolic panel  - FL Esophagram Complete; Future    2. Chronic upper GI bleeding  - CBC and differential  - Comprehensive metabolic panel  - FL Esophagram Complete; Future    3. Screening-pulmonary TB  - Quantiferon(R) - TB Gold Plus    4. Allergic rhinitis, unspecified seasonality, unspecified trigger      Labs: as above  Imaging: as above; may  also need a CT scan of the chest and/or bronchoscopy.  Meds: avoid any OTC pain  meds  Avoid caffeine, alcohol, citrus foods  Eat at regular interval.     Discussed that the reason for blood is unclear at this time but it is not due to "allergies".    FU: next visit to be scheduled based on the results.  Advised to go to ED if symptoms worsen.    More than 50% time of this 40 minute visit was spent in face to face counseling and co-ordination of care regarding above diagnosis and treatment options.      Penne Lash, MD

## 2018-04-20 ENCOUNTER — Encounter (INDEPENDENT_AMBULATORY_CARE_PROVIDER_SITE_OTHER): Payer: Self-pay | Admitting: Family Medicine

## 2018-04-21 ENCOUNTER — Telehealth (INDEPENDENT_AMBULATORY_CARE_PROVIDER_SITE_OTHER): Payer: Self-pay | Admitting: Family Medicine

## 2018-04-21 DIAGNOSIS — R7612 Nonspecific reaction to cell mediated immunity measurement of gamma interferon antigen response without active tuberculosis: Secondary | ICD-10-CM

## 2018-04-21 DIAGNOSIS — R042 Hemoptysis: Secondary | ICD-10-CM

## 2018-04-21 DIAGNOSIS — Z711 Person with feared health complaint in whom no diagnosis is made: Secondary | ICD-10-CM

## 2018-04-21 NOTE — Telephone Encounter (Signed)
Please see my message to patient from 04/20/18 and call her. Please advise to schedule CT Chest and not get the test I had ordered earlier (anyway I would like to change the earlier order - please advise her to discard it).  I have placed CT chest order in the chart. Please provide her the [facility] number for scheduling and advise to get it done ASAP. Thanks.

## 2018-04-22 NOTE — Telephone Encounter (Signed)
Spoke with the patient and discussed result and recommendations. Patient will call [facility] today and schedule CT. Order faxed.

## 2018-04-22 NOTE — Telephone Encounter (Signed)
Called patient's number and spouse's number and LMTCB on both numbers.

## 2018-04-23 ENCOUNTER — Telehealth (INDEPENDENT_AMBULATORY_CARE_PROVIDER_SITE_OTHER): Payer: Self-pay | Admitting: Family Medicine

## 2018-04-23 ENCOUNTER — Other Ambulatory Visit: Payer: Self-pay

## 2018-04-23 NOTE — Telephone Encounter (Signed)
Spoke with the patient and she stated she was confused on what [facility] stated because she scheduled for next Tuesday but [facility] stated they needed to verify with her insurance.    Spoke with [facility] and they stated they advised her to follow up with her insurance because the insurance showed inactive. Verified the insurance on file is the same as it shows in the chart at PCP office.     Called the patient back and she states when they were registering she initially submitted her old card but she realized and provided the active card and was advised the coverage was active. Advised the patient to call back and update [facility] on the status of the insurance information and request to move up the appointment if possible. Patient understood.

## 2018-04-23 NOTE — Telephone Encounter (Signed)
Pt returned phone call and may be reached at [phone]

## 2018-04-23 NOTE — Telephone Encounter (Signed)
Pt would like a return phone call from [place] [facility]. Pt may be reached at [phone]

## 2018-04-23 NOTE — Telephone Encounter (Signed)
LMTCB

## 2018-04-25 ENCOUNTER — Other Ambulatory Visit (INDEPENDENT_AMBULATORY_CARE_PROVIDER_SITE_OTHER): Payer: Self-pay

## 2018-04-25 DIAGNOSIS — Z711 Person with feared health complaint in whom no diagnosis is made: Secondary | ICD-10-CM

## 2018-04-25 DIAGNOSIS — R7612 Nonspecific reaction to cell mediated immunity measurement of gamma interferon antigen response without active tuberculosis: Secondary | ICD-10-CM

## 2018-04-25 DIAGNOSIS — R042 Hemoptysis: Secondary | ICD-10-CM

## 2018-04-25 NOTE — Telephone Encounter (Signed)
Called to follow up on status of CT scan. LMTCB

## 2018-04-25 NOTE — Telephone Encounter (Signed)
Patient states the CT was done two days ago. Printed result from [facility] and scanned in. Patient requests a mychart message from the provider regarding the result.

## 2018-04-27 ENCOUNTER — Encounter (INDEPENDENT_AMBULATORY_CARE_PROVIDER_SITE_OTHER): Payer: Self-pay | Admitting: Family Medicine

## 2018-04-27 NOTE — Telephone Encounter (Signed)
Please see my message to her about CT results and next steps.  She needs the order for "FL Esophagram Complete" from the chart. She can either come pick it or you fax to [facility] and she can call and schedule. Also, please place her on my schedule to follow up this week - 7th - any time between 11:15 and 12:30 that works for her (assuming the test will be done prior to that). Thanks.

## 2018-04-28 ENCOUNTER — Telehealth (INDEPENDENT_AMBULATORY_CARE_PROVIDER_SITE_OTHER): Payer: Self-pay | Admitting: Family Medicine

## 2018-04-28 NOTE — Telephone Encounter (Signed)
Spoke with the patient and discussed patient's last response to the provider and answered all questions. Reassured the patient there is Khalaya Mcgurn active TB infection. Advised the patient the provider has ordered a complete FL esophagram and advised to call [facility] and schedule the procedure preferably earlier this week, if possible. Patient is also scheduled for follow up with PCP on Friday 05/02/18 @1115 .   Discussed patient's symptoms since being seen; patient states she has had two occurances of bleeding from the throat since the last evaluation. Patient states last Saturday she experienced a itchy throat and coughed up dark blood with tissue present. Patient states she also for one day only the same day had lumbar back pain that were in two separate areas that was an aching pain even at rest. Patient states her back feels fine now. She also voiced feeling a "heaviness" in her neck during the back pain as well. She states last night she woke up at 0200 with the same itchy throat feeling and coughed up dark blood again with tissue present. Patient states last night it was a "little" amount of blood compared to last Saturday.   Patient will call [facility] today to schedule Esophagram and the order has been faxed over as well.  Encouraged to call back with any concerns when scheduling for assistance.

## 2018-04-28 NOTE — Telephone Encounter (Signed)
Pt called requesting to speak with nurse. Pt stated she missed her call.  Pt can be reached at  [phone]  Thanks

## 2018-04-28 NOTE — Telephone Encounter (Signed)
Patient called back. See other encounter.

## 2018-04-28 NOTE — Telephone Encounter (Signed)
LMTCB

## 2018-04-29 ENCOUNTER — Other Ambulatory Visit: Payer: Self-pay

## 2018-04-29 ENCOUNTER — Telehealth (INDEPENDENT_AMBULATORY_CARE_PROVIDER_SITE_OTHER): Payer: Self-pay | Admitting: Family Medicine

## 2018-04-29 NOTE — Telephone Encounter (Signed)
Patient called to inform Esophagram report is normal. There is no concern with throat or esophagus. Called [facility] to obtain reports, unable to retrieve online.     Patient stated she was told at [facility] about signs of bronchitis showing on previous CT report.     Patient is scheduled with Dr. Maryjean Ka on 05/02/2018 for Esophagram follow up.     Patient states today while clearing her throat, she noticed yellow mucus. Pt would like to know if Dr. Maximiano Coss recommends seeing a Pulmonologist or take any additional actions sooner than her appointment with Dr. Maryjean Ka on Friday - 05/02/2018 at 11:15am.

## 2018-04-29 NOTE — Telephone Encounter (Signed)
Pt would like a return phone call from Nurse Raynelle Fanning and may be reached at [phone]. Went to [facility] and was Dx with chronic bronchitis and would like to be seen by Dr. Maryjean Ka.

## 2018-04-29 NOTE — Telephone Encounter (Signed)
Spoke w/patient - please see previous encounter.

## 2018-04-29 NOTE — Telephone Encounter (Signed)
Patient would like callback from Nurse Raynelle Fanning at [phone]

## 2018-04-30 ENCOUNTER — Encounter (INDEPENDENT_AMBULATORY_CARE_PROVIDER_SITE_OTHER): Payer: Self-pay

## 2018-04-30 ENCOUNTER — Other Ambulatory Visit (INDEPENDENT_AMBULATORY_CARE_PROVIDER_SITE_OTHER): Payer: Self-pay

## 2018-04-30 DIAGNOSIS — R042 Hemoptysis: Secondary | ICD-10-CM

## 2018-04-30 DIAGNOSIS — K922 Gastrointestinal hemorrhage, unspecified: Secondary | ICD-10-CM

## 2018-04-30 NOTE — Telephone Encounter (Signed)
Informed patient to keep Friday appointment with Dr. Maryjean Ka to further discuss results and next step. Patient verbalized understanding.     Confirmed upcoming appointment on 05/02/2018 at 11:15am.

## 2018-05-02 ENCOUNTER — Ambulatory Visit (INDEPENDENT_AMBULATORY_CARE_PROVIDER_SITE_OTHER): Payer: No Typology Code available for payment source | Admitting: Family Medicine

## 2018-05-02 ENCOUNTER — Encounter (INDEPENDENT_AMBULATORY_CARE_PROVIDER_SITE_OTHER): Payer: Self-pay | Admitting: Family Medicine

## 2018-05-02 ENCOUNTER — Telehealth (INDEPENDENT_AMBULATORY_CARE_PROVIDER_SITE_OTHER): Payer: Self-pay | Admitting: Family Medicine

## 2018-05-02 VITALS — BP 102/72 | HR 73 | Temp 97.9°F | Wt 129.0 lb

## 2018-05-02 DIAGNOSIS — M549 Dorsalgia, unspecified: Secondary | ICD-10-CM

## 2018-05-02 DIAGNOSIS — J479 Bronchiectasis, uncomplicated: Secondary | ICD-10-CM

## 2018-05-02 DIAGNOSIS — Z8611 Personal history of tuberculosis: Secondary | ICD-10-CM

## 2018-05-02 DIAGNOSIS — M545 Low back pain, unspecified: Secondary | ICD-10-CM

## 2018-05-02 DIAGNOSIS — M419 Scoliosis, unspecified: Secondary | ICD-10-CM

## 2018-05-02 DIAGNOSIS — R042 Hemoptysis: Secondary | ICD-10-CM

## 2018-05-02 DIAGNOSIS — G8929 Other chronic pain: Secondary | ICD-10-CM

## 2018-05-02 NOTE — Progress Notes (Signed)
Have you seen any new specialists/physicians since you were last here? Lisett Dirusso      Limb alert protocol reviewed?  Yes    Limb questionnaire reviewed.   Patient denies any limb restrictions.

## 2018-05-02 NOTE — Telephone Encounter (Signed)
Pt would like to request a different pulmonologist. Pt states the one she was referred to is unavailable and she would like a pulmonologist that is located in [place] area. Please advise, pt can best be reached at [phone].

## 2018-05-04 NOTE — Telephone Encounter (Signed)
Please see MyChart communication with patient.  Send following to Dr. Jonnie Finner office (if you are able to get her sooner, that would be great) -       Letter to Dr. Lanora Manis  OV notes from 04/16/18  CT chest results  Two CXR results in the chart  Barium swallow results  Lab results from 5/22    Thanks.

## 2018-05-04 NOTE — Progress Notes (Signed)
Subjective:      Date: 05/02/2018 10:13 PM   Patient ID: Kim Booker is a 42 y.o. female.    Chief Complaint:  Chief Complaint   Patient presents with   . 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 Back Pain     noted left upper side       HPI:    History taken on 05/02/18:  Patient reports that blood coming out when spitting has improved - not bright red or dark red any more but more pale. No other new respiratory symptoms.     CT chest indicated an area of bronchiectasis and scarring in left lower lob; no evidence of TB.    Barium swallow normal except indicates thoracic spine scoliosis.     Has chronic upper as well as lower back pain; no radiation to extremities. No injury.         History taken on 04/16/18:  Patient presents with c/o spitting blood intermittently. Problems first appeared in year 2000 when she used to live in [place] (she moved here 6 - 7 yrs ago). She had small amount of blood that came out after a cough - this was a one time episode and was not evaluated by a doctor. The symptoms appeared again in 2013 and this time lasted for 3 days; she did not visit any doctor for this problem. Since then was fine till two months ago when she spitted blood off and on for 1 week and went to see a pulmonologist, Dr. Sharma Covert who diagnosed her with pneumonia. She was treated with antibiotics and an x-ray afterwards was normal. The bleeding stopped for few weeks and then reappeared. Patient was then seen in ED about a month ago where a CXR was normal. She was told to go back to pulmonologist which she did not because the "doctor had previously said she didn't need to see him again". Someone mentioned to her that this could be allergies so she put herself on claritin daily which has not made any difference. She is continuing to spit blood intermittently; symptoms have now worsened - ore frequent and lot more blood comes out  when she spits. She showed pictures of what she is spitting - appears to be chunks of clotted blood - a teaspoon or so with each episode.  Patient describes feeling slight irritation in throat, followed by spitting of blood. There is no persistent cough. The episodes have been occurring a few times a day.  She feels fine otherwise except fatigue which she relates to taking care of baby. Denies cough,, SOB, fever, weakness. Denies abdominal pain, N/V. Appetite normal but she does forget to eat sometimes when busy with the baby.    H/o tuberculosis - diagnosed in 2006 while in [place]. Per patient she completed treatment and cleared from TB.    She denies symptoms of heart burn; denies alcohol intake. Says she has had an upper endoscopy in [place] in 2010.      History reviewed in detail as written below.    Past Medical History:   Diagnosis Date   . AMA (advanced maternal age) multigravida 35+    . Bronchitis     frequent bronchitis in her country [place] but not asthma; better here       Past Surgical History:   Procedure Laterality Date   . CESAREAN SECTION N/A  09/30/2017    Procedure: CESAREAN SECTION;  Surgeon: Buena Irish, MD;  Location: [facility];  Service: Gynecology;  Laterality: N/A;       Social History     Social History   . Marital status: Married     Spouse name: N/A   . Number of children: N/A   . Years of education: N/A     Occupational History   . Not on file.     Social History Main Topics   . Smoking status: Never Smoker   . Smokeless tobacco: Never Used   . Alcohol use No   . Drug use: No   . Sexual activity: Yes     Partners: Female     Other Topics Concern   . Not on file     Social History Narrative    Exercise no    Special diet no    Caffeine no           Family History   Problem Relation Age of Onset   . Heart disease Father         died from heart attack at age 80   . Hypertension Mother    . Stroke Mother         died at age 22   . Breast cancer Neg Hx    . Ovarian cancer  Neg Hx    . Colon cancer Neg Hx        No Known Allergies    Current Outpatient Prescriptions   Medication Sig Dispense Refill   . loratadine (CLARITIN) 10 MG tablet Take 10 mg by mouth daily     . Prenatal Vit-Fe Fumarate-FA (PRENATAL PO) Take by mouth.       No current facility-administered medications for this visit.        Vitals:  Vitals:    05/02/18 1153   BP: 102/72   Pulse: 73   Temp: 97.9 F (36.6 C)   Weight: 58.5 kg (129 lb)         ROS:  Review of Systems   Constitutional: Positive for fatigue. Negative for activity change, appetite change, chills, diaphoresis, fever and unexpected weight change.   HENT: Positive for congestion (mild off and on). Negative for sinus pain and sinus pressure.    Respiratory: Negative.  Cough: denies cough.    Cardiovascular: Negative.    Gastrointestinal: Negative for abdominal pain, blood in stool, constipation, diarrhea, nausea and vomiting.   Genitourinary: Negative.    Musculoskeletal: Positive for back pain (chronic - upper and lower back).   Skin: Negative for rash.   Neurological: Negative.    Psychiatric/Behavioral: Negative.            Objective:     Physical Exam   Constitutional: She appears well-developed.   Cardiovascular: Normal rate, regular rhythm and normal heart sounds.    No murmur heard.  Pulmonary/Chest: Effort normal and breath sounds normal. No respiratory distress.   Musculoskeletal:        Thoracic back: She exhibits tenderness (mild para-spinal muscle tenderness).        Lumbar back: She exhibits tenderness (mild para-spinal muscle tenderness).   Lymphadenopathy:     She has no axillary adenopathy.   Neurological: She is alert.   Vitals reviewed.         Assessment/Plan:       1. Hemoptysis  - Pulmonary Disease Referral: Lang Snow, MD ([facility])  2. Bronchiectasis without complication  - Pulmonary Disease Referral: Lang Snow, MD ([facility])    3. History  of TB (tuberculosis)  - Pulmonary Disease Referral: Lang Snow, MD ([facility])    4. Chronic bilateral low back pain without sciatica  - Ambulatory referral to Physical Therapy    5. Upper back pain, chronic  - Ambulatory referral to Physical Therapy    6. Scoliosis of thoracic spine, unspecified scoliosis type  - Ambulatory referral to Physical Therapy      Pulmonology Consult  For further evaluation - patient was provided with copies of labs and imaging to share.    Discussed heat to the stiff muscles, posture correction.  Referral to PT for evaluation and treatment.  FU or call if symptoms worsen or do not improve.      Penne Lash, MD

## 2018-05-05 ENCOUNTER — Inpatient Hospital Stay
Payer: No Typology Code available for payment source | Attending: Family Medicine | Admitting: Rehabilitative and Restorative Service Providers"

## 2018-05-05 DIAGNOSIS — M545 Low back pain, unspecified: Secondary | ICD-10-CM | POA: Insufficient documentation

## 2018-05-05 NOTE — Telephone Encounter (Signed)
Called patient and provided update that the office is waiting for call back from Pulmonologist office to discuss scheduling appointment.

## 2018-05-05 NOTE — Telephone Encounter (Signed)
Called Dr. Jonnie Finner office and left message requesting call back to discuss scheduling a new patient appointment. Called Baird Lyons [phone].

## 2018-05-05 NOTE — Progress Notes (Signed)
Name: Kim Booker Age: 42 y.o.   Referring Physician: Penne Lash, MD   Date of Injury: 09/26/2017  Date Care Plan Established/Reviewed: 05/05/2018  Date Treatment Started: 05/05/2018  Visit Count: 1   Diagnosis:   1. Acute midline low back pain without sciatica        Subjective     History of Present Illness   History of Present Illness: Gave birth in [place], had C-section--started having back pain after that. Back feels numb and weak at midline. Describes pain as sharp and intermittent.   Pain doesn't travel down leg.  Aggs- carrying baby, stopped exercising  Eases--exercise, yoga, stretching  Carries baby 5 hours every day  Able to sleep throughout the night.  X-ray 2 weeks ago--mild Scoliosis  Functional Limitations (PLOF): Walk 15 min before back gets numb/painful  Able to cook and clean  Holding baby for extended periods of time is painful.    Reports a little bit of difficulty walking more than a mile, performing heavy activities around the home, performing usual hobbies or recreational activities, bending or stooping, and performing usual house work    Prior to onset / exacerbation, pt was previously able to perform all activities listed above normally and without limitation or difficulty.      Outcome Measure   Tool Used/Details: FOTO    Pain   Patient reports pain is intermittent.      Objective     Balance   Left   Eyes Open (Left)(sec)      Trial 1: 30 seconds  Right   Eyes Open (Right)(sec)      Trial 1: 30 seconds    Gait   Right Side: altered trunk rotation with decreased arm swing    Palpation  Pain w/ L5- S1 central PA & (B) UPAs  TTP: (B) lumbar paraspinals    Tests       Lumbar/Pelvic Girdle/Sacrum (Left)   Negative sacrum compression and gapping.    Lumbar/Pelvic Girdle/Sacrum (Right)   Negative: sacrum compression and gapping    Left Hip   Negative scour.     Right Hip   Negative scour.       BP: 103/72 Heart Rate: 72     Outcomes:  Goal: 86 Initial         Primary Functional Status  Measure 74     Pain: /10 /10 /10   Pain last 24 hrs 4     Pain last 30 days 2     Greatest pain last 30 days 7     PSFS Activities: /10 /10 /10   1. Carry baby  10     2. Sit up 8     3. Walking 10       AROM: Lumbar Spine   Initial      Flexion 130      Extension 40       R L R L   Rotation WNL WNL     Side Bending 30 30     (blank fields were intentionally left blank)      Initial R   R LE Strength  PSI Initial  L   L   26.6  Hip Flexion(L1/2) 26.8    39.5  Hip Extension   (knee flexed) 32.8    21.5  Hip Abduction 21.2    23.3  Knee Flexion (S1) 19.6    29.1  Knee Extension (L3) 26.5      Ankle DF (L4)  Toe Ext (L5)     (blank fields were intentionally left blank)    IE 05/05/18:  Prone Extensor test: 10 sec  Supine flexor endurance test with shoulder blades off table(unable to get fingertips past knees): 25 sec    Treatment     Therapeutic Exercises   Justification: To improve ROM, Flexibility and Strength  Instructed in the following for HEP:    Access Code: [email or web address]   URL: [email or web address]   Date: 05/05/2018   Prepared by: Pandora Leiter      Exercises Supine Bridge - 15 reps - 3 sets - 5 hold - 1x daily - 7x weekly   Supine Posterior Pelvic Tilt - 10 reps - 3 sets - 10 hold - 1x daily - 7x weekly   Abdominal Press into [place] and Roll - 10 reps - 3 sets - 5 hold - 1x daily - 7x weekly       Modalities   Skin check completed         ---      ---   Total Time   Timed Minutes  10 minutes   Untimed Minutes  30 minutes   Total Time  40 minutes        Assessment   Kim Booker is a 42 y.o. female presenting with bilateral lumbar back pain who requires Physical Therapy for the following:  Impairments: decreased lumbar extensor and flexor endurance, TTP bilateral lumbar paraspinals, decreased standing endurance , pain with L5-S1 central PA and UPA both sides    Pain located: Lumbar spine at midline    Clinical presentation: stable   Barriers to therapy: Comorbidities  none  Prior Level of Function: Walk 15 min before back gets numb/painful  Able to cook and clean  Holding baby for extended periods of time is painful.    Reports a little bit of difficulty walking more than a mile, performing heavy activities around the home, performing usual hobbies or recreational activities, bending or stooping, and performing usual house work    Prior to onset / exacerbation, pt was previously able to perform all activities listed above normally and without limitation or difficulty.    Prognosis: good  Plan   Visits per week: 2  Number of Sessions: 16  Direct One on One  16109: Therapeutic Exercise: To Develop Strength and Endurance, ROM and Flexibility  [id number]: Neuromuscular Reeducation  97140: Manual Therapy techniques (mobilization, manipulation, manual traction) (Central and  both Unilateral PAs to L4,L5,S1 grade 2-4, STM to B lumbar paraspinals)  97530: Therapeutic Activities: Dynamic activities to improve functional performance  60454: Ultrasound  Dry Needling  Supervised Modalities  97010: Thermal modalities: hot/cold packs  09811: Electrical stimulation  Next Visit:  Review HEP and tolerance  Focus on core/trunk strength  Endurance for lumbar extensors and flexors  Central and  both Unilateral PAs to L4,L5,S1 grade 2-4, STM to B lumbar paraspinals  Assess neck and upper cervical pain      Goals    Goal 1:  Improve Physical FS Primary Measure from 74 to 86 or better to demonstrate change for significant functional improvement.      Sessions:  16   Progression:  new      Goal 2:  Patient will demonstrate independence in prescribed HEP with proper form, sets and reps for safe discharge to an independent program.   Sessions:  16   Progression:  new       Goal 3:  Improve lumbar extensor endurance test to  1 minute in order to improve standing endurance without pain.   Sessions:  16   Progression:  new      Goal 4:  Improve lumbar flexor endurance test to 1 minute in order to be able to pick  up baby without pain   Sessions:  16   Progression:  new          Goal 5:  Increase hip abduction strength 30 psi to be able to independently transfer in/out of vehicle.    Sessions:  16   Progression:  new                         Ward Givens, PT ,DPT, CLT-UE [place] [phone]  05/05/2018   Payton Doughty, SPT

## 2018-05-05 NOTE — Telephone Encounter (Signed)
Scheduled with Dr. Lanora Manis for tomorrow and faxed all relevant records. Please see other encounter.

## 2018-05-05 NOTE — Telephone Encounter (Signed)
Spoke with Kim Booker at Dr. Jonnie Finner office and scheduled for tomorrow at 10:15 am with Dr. Lanora Manis.     Made the patient aware and faxed over all relevant documentation.

## 2018-05-06 ENCOUNTER — Inpatient Hospital Stay: Payer: No Typology Code available for payment source | Admitting: Rehabilitative and Restorative Service Providers"

## 2018-05-06 NOTE — Telephone Encounter (Signed)
Nothing else required on our end. Patient's insurance does not require referral to be processed.     Thanks!

## 2018-05-09 ENCOUNTER — Inpatient Hospital Stay: Payer: No Typology Code available for payment source

## 2018-05-13 ENCOUNTER — Inpatient Hospital Stay: Payer: No Typology Code available for payment source

## 2018-05-16 ENCOUNTER — Inpatient Hospital Stay: Payer: No Typology Code available for payment source | Admitting: Physical Therapist

## 2018-05-20 ENCOUNTER — Inpatient Hospital Stay: Payer: No Typology Code available for payment source

## 2018-05-23 ENCOUNTER — Inpatient Hospital Stay: Payer: No Typology Code available for payment source | Admitting: Physical Therapist

## 2018-07-17 NOTE — PT/OT Therapy Note (Signed)
Discontinuation of Therapy Services    Kim Booker did not complete prescribed physical therapy visits.      Status is unknown at this time, physical therapy has been discontinued and patient has been discharged from care.  The last therapy note is below for review.    Please feel free to contact me with any questions regarding the care of Kim Booker.    Sincerely,    Lorelle Formosa. Colonel Bald, PT, DPT, CLT-UE  [phone]        07/17/2018

## 2020-03-13 ENCOUNTER — Other Ambulatory Visit (INDEPENDENT_AMBULATORY_CARE_PROVIDER_SITE_OTHER): Payer: Self-pay | Admitting: Family Medicine

## 2020-03-28 ENCOUNTER — Other Ambulatory Visit (INDEPENDENT_AMBULATORY_CARE_PROVIDER_SITE_OTHER): Payer: Self-pay | Admitting: Family Medicine

## 2020-08-04 ENCOUNTER — Other Ambulatory Visit (INDEPENDENT_AMBULATORY_CARE_PROVIDER_SITE_OTHER): Payer: Self-pay | Admitting: Family Medicine

## 2020-12-07 ENCOUNTER — Other Ambulatory Visit: Payer: Self-pay

## 2020-12-07 ENCOUNTER — Ambulatory Visit (INDEPENDENT_AMBULATORY_CARE_PROVIDER_SITE_OTHER): Payer: 59 | Admitting: Obstetrics and Gynecology

## 2020-12-07 ENCOUNTER — Other Ambulatory Visit (HOSPITAL_COMMUNITY)
Admission: RE | Admit: 2020-12-07 | Discharge: 2020-12-07 | Disposition: A | Discharge status: Home / Self Care | Payer: Medicaid Other | Source: Ambulatory Visit | Attending: Obstetrics and Gynecology | Admitting: Obstetrics and Gynecology

## 2020-12-07 ENCOUNTER — Encounter: Payer: Self-pay | Admitting: Obstetrics and Gynecology

## 2020-12-07 DIAGNOSIS — Z3480 Encounter for supervision of other normal pregnancy, unspecified trimester: Secondary | ICD-10-CM | POA: Insufficient documentation

## 2020-12-07 DIAGNOSIS — O09529 Supervision of elderly multigravida, unspecified trimester: Secondary | ICD-10-CM | POA: Insufficient documentation

## 2020-12-07 DIAGNOSIS — Z3A21 21 weeks gestation of pregnancy: Secondary | ICD-10-CM

## 2020-12-07 DIAGNOSIS — O0932 Supervision of pregnancy with insufficient antenatal care, second trimester: Secondary | ICD-10-CM | POA: Insufficient documentation

## 2020-12-07 NOTE — Progress Notes (Signed)
INITIAL PRENATAL VISIT NOTE  Subjective:  Christine Ibarra is a 45 y.o. G2P1001 at [redacted]w[redacted]d by approximate LMP being seen today for her initial prenatal visit. This is a planned pregnancy.  She was using IUD for birth control previously, but had it removed several months ago. She has an obstetric history significant for cesarean section due to  failure to dilate. She has a medical history significant for umbilical hernia.  Patient reports no complaints.  Contractions: Not present.  .  Movement: Present. Denies leaking of fluid.    History reviewed. No pertinent past medical history.  Past Surgical History:  Procedure Laterality Date  . CESAREAN SECTION      OB History  Gravida Para Term Preterm AB Living  2 1 1     1   SAB IAB Ectopic Multiple Live Births          1    # Outcome Date GA Lbr Len/2nd Weight Sex Delivery Anes PTL Lv  2 Current           1 Term 09/30/17 [redacted]w[redacted]d   F CS-LTranv   LIV    Social History   Socioeconomic History  . Marital status: Married    Spouse name: Not on file  . Number of children: Not on file  . Years of education: Not on file  . Highest education level: Not on file  Occupational History  . Not on file  Tobacco Use  . Smoking status: Never Smoker  . Smokeless tobacco: Not on file  Substance and Sexual Activity  . Alcohol use: Not Currently  . Drug use: Not Currently  . Sexual activity: Yes  Other Topics Concern  . Not on file  Social History Narrative  . Not on file   Social Determinants of Health   Financial Resource Strain: Not on file  Food Insecurity: Not on file  Transportation Needs: Not on file  Physical Activity: Not on file  Stress: Not on file  Social Connections: Not on file    Family History  Problem Relation Age of Onset  . Hypertension Mother   . Diabetes Father      Current Outpatient Medications:  .  Prenatal Vit-Fe Fumarate-FA (MULTIVITAMIN-PRENATAL) 27-0.8 MG TABS tablet, Take 1 tablet by mouth daily at  12 noon., Disp: , Rfl:   Not on File  Review of Systems: Negative except for what is mentioned in HPI.  Objective:   Vitals:   12/07/20 1106 12/07/20 1114  BP: 103/67   Pulse: 81   Weight: 143 lb (64.9 kg)   Height:  5\' 2"  (1.575 m)    Fetal Status: Fetal Heart Rate (bpm): 150   Movement: Present     Physical Exam: BP 103/67   Pulse 81   Ht 5\' 2"  (1.575 m)   Wt 143 lb (64.9 kg)   LMP 07/07/2020   BMI 26.16 kg/m  CONSTITUTIONAL: Well-developed, well-nourished female in no acute distress.  NEUROLOGIC: Alert and oriented to person, place, and time. Normal reflexes, muscle tone coordination. No cranial nerve deficit noted. PSYCHIATRIC: Normal mood and affect. Normal behavior. Normal judgment and thought content. SKIN: Skin is warm and dry. No rash noted. Not diaphoretic. No erythema. No pallor. HENT:  Normocephalic, atraumatic, External right and left ear normal. Oropharynx is clear and moist EYES: Conjunctivae and EOM are normal. NECK: Normal range of motion, supple, no masses CARDIOVASCULAR: Normal heart rate noted, regular rhythm RESPIRATORY: Effort and breath sounds normal, no problems with respiration noted BREASTS:  symmetric, non-tender, no masses palpable ABDOMEN: Soft, nontender, nondistended, gravid, small reducible umbilical hernia just above umbilicus, well healed c section scar noted GU: normal appearing external female genitalia, normal appearing cervix, scant white discharge in vagina, no lesions noted, pap taken Bimanual: 18 weeks sized uterus, no adnexal tenderness or palpable lesions noted MUSCULOSKELETAL: Normal range of motion. EXT:  No edema and no tenderness. 2+ distal pulses.   Assessment and Plan:  Pregnancy: G2P1001 at [redacted]w[redacted]d by approximate LMP  1. [redacted] weeks gestation of pregnancy   2. Late prenatal care affecting pregnancy in second trimester Dating/anatomy scan scheduled Pt would like TOLAC, op notes requested from [place] , [place]  3.  Supervision of other normal pregnancy, antepartum  - Cytology - PAP( [place]) - Cervicovaginal ancillary only( [place]) - CBC/D/Plt+RPR+Rh+ABO+Rub Ab... - Culture, OB Urine - Genetic Screening - Hemoglobin A1c - [place] MFM OB DETAIL +14 WK; Future   Preterm labor symptoms and general obstetric precautions including but not limited to vaginal bleeding, contractions, leaking of fluid and fetal movement were reviewed in detail with the patient.  Please refer to After Visit Summary for other counseling recommendations.   Return in about 4 weeks (around 01/04/2021) for ROB, in person.  Warden Fillers 12/07/2020 1:06 PM

## 2020-12-07 NOTE — Patient Instructions (Signed)

## 2020-12-07 NOTE — Progress Notes (Signed)
Pt states she had pap smear with last pregnancy.  Pt has problem with umbilical area, ?hernia, also has area on Right LE.  Pt having Left side "lung" pain recently.  Pt states her family had Covid in November and she developed cough and this pain. Pt states she was not tested.

## 2020-12-08 LAB — CERVICOVAGINAL ANCILLARY ONLY
Chlamydia: NEGATIVE
Comment: NEGATIVE
Comment: NORMAL
Neisseria Gonorrhea: NEGATIVE

## 2020-12-08 LAB — CBC/D/PLT+RPR+RH+ABO+RUB AB...
Antibody Screen: NEGATIVE
Basophils Absolute: 0 10*3/uL (ref 0.0–0.2)
Basos: 0 %
EOS (ABSOLUTE): 0.1 10*3/uL (ref 0.0–0.4)
Eos: 1 %
HCV Ab: <0.1 s/co ratio (ref 0.0–0.9)
HIV Screen 4th Generation wRfx: NONREACTIVE
Hematocrit: 35.7 % (ref 34.0–46.6)
Hemoglobin: 11.7 g/dL (ref 11.1–15.9)
Hepatitis B Surface Ag: NEGATIVE
Immature Grans (Abs): 0.1 10*3/uL (ref 0.0–0.1)
Immature Granulocytes: 1 %
Lymphocytes Absolute: 1.5 10*3/uL (ref 0.7–3.1)
Lymphs: 17 %
MCH: 30.1 pg (ref 26.6–33.0)
MCHC: 32.8 g/dL (ref 31.5–35.7)
MCV: 92 fL (ref 79–97)
Monocytes Absolute: 0.7 10*3/uL (ref 0.1–0.9)
Monocytes: 8 %
Neutrophils Absolute: 6.4 10*3/uL (ref 1.4–7.0)
Neutrophils: 73 %
Platelets: 272 10*3/uL (ref 150–450)
RBC: 3.89 x10E6/uL (ref 3.77–5.28)
RDW: 14.4 % (ref 11.7–15.4)
RPR Ser Ql: NONREACTIVE
Rh Factor: POSITIVE
Rubella Antibodies, IGG: 1.2 index (ref >0.99)
WBC: 8.7 10*3/uL (ref 3.4–10.8)

## 2020-12-08 LAB — HEMOGLOBIN A1C
Est. average glucose Bld gHb Est-mCnc: 103 mg/dL
Hgb A1c MFr Bld: 5.2 % (ref 4.8–5.6)

## 2020-12-08 LAB — HCV INTERPRETATION

## 2020-12-09 LAB — CULTURE, OB URINE

## 2020-12-09 LAB — CYTOLOGY - PAP
Adequacy: ABSENT
Comment: NEGATIVE
Diagnosis: NEGATIVE
High risk HPV: POSITIVE — AB

## 2020-12-09 LAB — URINE CULTURE, OB REFLEX

## 2020-12-13 ENCOUNTER — Encounter: Payer: Self-pay | Admitting: Obstetrics and Gynecology

## 2020-12-14 ENCOUNTER — Encounter: Payer: Self-pay | Admitting: *Deleted

## 2020-12-14 ENCOUNTER — Ambulatory Visit: Payer: Medicaid Other | Admitting: *Deleted

## 2020-12-14 ENCOUNTER — Other Ambulatory Visit: Payer: Self-pay

## 2020-12-14 ENCOUNTER — Ambulatory Visit: Payer: Medicaid Other | Attending: Obstetrics and Gynecology

## 2020-12-14 ENCOUNTER — Telehealth: Payer: Self-pay | Admitting: *Deleted

## 2020-12-14 ENCOUNTER — Other Ambulatory Visit: Payer: Self-pay | Admitting: *Deleted

## 2020-12-14 VITALS — BP 95/62 | HR 82

## 2020-12-14 DIAGNOSIS — O09522 Supervision of elderly multigravida, second trimester: Secondary | ICD-10-CM | POA: Insufficient documentation

## 2020-12-14 DIAGNOSIS — O4440 Low lying placenta NOS or without hemorrhage, unspecified trimester: Secondary | ICD-10-CM

## 2020-12-14 DIAGNOSIS — Z3480 Encounter for supervision of other normal pregnancy, unspecified trimester: Secondary | ICD-10-CM | POA: Insufficient documentation

## 2020-12-14 NOTE — Telephone Encounter (Addendum)
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 She was informed of need for repeat Pap in one year. Pt's questions were answered and she voiced understanding of all information provided.

## 2020-12-20 ENCOUNTER — Encounter: Payer: Self-pay | Admitting: Obstetrics and Gynecology

## 2021-01-04 ENCOUNTER — Other Ambulatory Visit: Payer: Self-pay

## 2021-01-04 ENCOUNTER — Ambulatory Visit (INDEPENDENT_AMBULATORY_CARE_PROVIDER_SITE_OTHER): Payer: Medicaid Other | Admitting: Women's Health

## 2021-01-04 VITALS — BP 104/65 | HR 90 | Wt 153.6 lb

## 2021-01-04 DIAGNOSIS — M549 Dorsalgia, unspecified: Secondary | ICD-10-CM

## 2021-01-04 DIAGNOSIS — O4440 Low lying placenta NOS or without hemorrhage, unspecified trimester: Secondary | ICD-10-CM | POA: Insufficient documentation

## 2021-01-04 DIAGNOSIS — Z3480 Encounter for supervision of other normal pregnancy, unspecified trimester: Secondary | ICD-10-CM

## 2021-01-04 DIAGNOSIS — Z98891 History of uterine scar from previous surgery: Secondary | ICD-10-CM | POA: Insufficient documentation

## 2021-01-04 DIAGNOSIS — O0932 Supervision of pregnancy with insufficient antenatal care, second trimester: Secondary | ICD-10-CM

## 2021-01-04 DIAGNOSIS — Z3A24 24 weeks gestation of pregnancy: Secondary | ICD-10-CM

## 2021-01-04 DIAGNOSIS — O99891 Other specified diseases and conditions complicating pregnancy: Secondary | ICD-10-CM

## 2021-01-04 DIAGNOSIS — O2602 Excessive weight gain in pregnancy, second trimester: Secondary | ICD-10-CM | POA: Insufficient documentation

## 2021-01-04 NOTE — Patient Instructions (Addendum)
Maternity Assessment Unit (MAU)  The Maternity Assessment Unit (MAU) is located at the [facility] at [facility]. The address is: [address], [place], [place], [place] [address]. Please see map below for additional directions.    The Maternity Assessment Unit is designed to help you during your pregnancy, and for up to 6 weeks after delivery, with any pregnancy- or postpartum-related emergencies, if you think you are in labor, or if your water has broken. For example, if you experience nausea and vomiting, vaginal bleeding, severe abdominal or pelvic pain, elevated blood pressure or other problems related to your pregnancy or postpartum time, please come to the Maternity Assessment Unit for assistance.         Preterm Labor The normal length of a pregnancy is 39-41 weeks. Preterm labor is when labor starts before 37 completed weeks of pregnancy. Babies who are born prematurely and survive may not be fully developed and may be at an increased risk for long-term problems such as cerebral palsy, developmental delays, and vision and hearing problems. Babies who are born too early may have problems soon after birth. Problems may include regulating blood sugar, body temperature, heart rate, and breathing rate. These babies often have trouble with feeding. The risk of having problems is highest for babies who are born before 34 weeks of pregnancy. What are the causes? The exact cause of this condition is not known. What increases the risk? You are more likely to have preterm labor if you have certain risk factors that relate to your medical history, problems with present and past pregnancies, and lifestyle factors. Medical history  You have abnormalities of the uterus, including a short cervix.  You have STIs (sexually transmitted infections), or other infections of the urinary tract and the vagina.  You have chronic illnesses, such as blood clotting  problems, diabetes, or high blood pressure.  You are overweight or underweight. Present and past pregnancies  You have had preterm labor before.  You are pregnant with twins or other multiples.  You have been diagnosed with a condition in which the placenta covers your cervix (placenta previa).  You waited less than 6 months between giving birth and becoming pregnant again.  Your unborn baby has some abnormalities.  You have vaginal bleeding during pregnancy.  You became pregnant through in vitro fertilization (IVF). Lifestyle and environmental factors  You use tobacco products.  You drink alcohol.  You use street drugs.  You have stress and no social support.  You experience domestic violence.  You are exposed to certain chemicals or environmental pollutants. Other factors  You are younger than age 30 or older than age 67. What are the signs or symptoms? Symptoms of this condition include:  Cramps similar to those that can happen during a menstrual period. The cramps may happen with diarrhea.  Pain in the abdomen or lower back.  Regular contractions that may feel like tightening of the abdomen.  A feeling of increased pressure in the pelvis.  Increased watery or bloody mucus discharge from the vagina.  Water breaking (ruptured amniotic sac). How is this diagnosed? This condition is diagnosed based on:  Your medical history and a physical exam.  A pelvic exam.  An ultrasound.  Monitoring your uterus for contractions.  Other tests, including: ? A swab of the cervix to check for a chemical called fetal fibronectin. ? Urine tests. How is this treated? Treatment for this condition depends on the length of your pregnancy,  your condition, and the health of your baby. Treatment may include:  Taking medicines, such as: ? Hormone medicines. These may be given early in pregnancy to help support the pregnancy. ? Medicines to stop contractions. ? Medicines to  help mature the baby's lungs. These may be prescribed if the risk of delivery is high. ? Medicines to prevent your baby from developing cerebral palsy.  Bed rest. If the labor happens before 34 weeks of pregnancy, you may need to stay in the hospital.  Delivery of the baby. Follow these instructions at home:  Do not use any products that contain nicotine or tobacco, such as cigarettes, e-cigarettes, and chewing tobacco. If you need help quitting, ask your health care provider.  Do not drink alcohol.  Take over-the-counter and prescription medicines only as told by your health care provider.  Rest as told by your health care provider.  Return to your normal activities as told by your health care provider. Ask your health care provider what activities are safe for you.  Keep all follow-up visits as told by your health care provider. This is important.   How is this prevented? To increase your chance of having a full-term pregnancy:  Do not use street drugs or medicines that have not been prescribed to you during your pregnancy.  Talk with your health care provider before taking any herbal supplements, even if you have been taking them regularly.  Make sure you gain a healthy amount of weight during your pregnancy.  Watch for infection. If you think that you might have an infection, get it checked right away. Symptoms of infection may include: ? Fever. ? Abnormal vaginal discharge or discharge that smells bad. ? Pain or burning with urination. ? Needing to urinate urgently. ? Frequently urinating or passing small amounts of urine frequently. ? Blood in your urine. ? Urine that smells bad or unusual.  Tell your health care provider if you have had preterm labor before. Contact a health care provider if:  You think you are going into preterm labor.  You have signs or symptoms of preterm labor.  You have symptoms of infection. Get help right away if:  You are having regular,  painful contractions every 5 minutes or less.  Your water breaks. Summary  Preterm labor is labor that starts before you reach 37 weeks of pregnancy.  Delivering your baby early increases your baby's risk of developing lifelong problems.  The exact cause of preterm labor is unknown. However, having an abnormal uterus, an STI (sexually transmitted infection), or vaginal bleeding during pregnancy increases your risk for preterm labor.  Keep all follow-up visits as told by your health care provider. This is important.  Contact a health care provider if you have signs or symptoms of preterm labor. This information is not intended to replace advice given to you by your health care provider. Make sure you discuss any questions you have with your health care provider. Document Revised: 12/15/2019 Document Reviewed: 12/15/2019 Elsevier Patient Education  2021 Elsevier Inc.       [email or web address]">  Third Trimester of Pregnancy  The third trimester of pregnancy is from week 28 through week 40. This is months 7 through 9. The third trimester is a time when the unborn baby (fetus) is growing rapidly. At the end of the ninth month, the fetus is about 20 inches long and weighs 6-10 pounds. Body changes during your third trimester During the third trimester, your body will continue to go through many  changes. The changes vary and generally return to normal after your baby is born. Physical changes  Your weight will continue to increase. You can expect to gain 25-35 pounds (11-16 kg) by the end of the pregnancy if you begin pregnancy at a normal weight. If you are underweight, you can expect to gain 28-40 lb (about 13-18 kg), and if you are overweight, you can expect to gain 15-25 lb (about 7-11 kg).  You may begin to get stretch marks on your hips, abdomen, and breasts.  Your breasts will continue to grow and may hurt. A yellow fluid  (colostrum) may leak from your breasts. This is the first milk you are producing for your baby.  You may have changes in your hair. These can include thickening of your hair, rapid growth, and changes in texture. Some people also have hair loss during or after pregnancy, or hair that feels dry or thin.  Your belly button may stick out.  You may notice more swelling in your hands, face, or ankles. Health changes  You may have heartburn.  You may have constipation.  You may develop hemorrhoids.  You may develop swollen, bulging veins (varicose veins) in your legs.  You may have increased body aches in the pelvis, back, or thighs. This is due to weight gain and increased hormones that are relaxing your joints.  You may have increased tingling or numbness in your hands, arms, and legs. The skin on your abdomen may also feel numb.  You may feel short of breath because of your expanding uterus. Other changes  You may urinate more often because the fetus is moving lower into your pelvis and pressing on your bladder.  You may have more problems sleeping. This may be caused by the size of your abdomen, an increased need to urinate, and an increase in your body's metabolism.  You may notice the fetus "dropping," or moving lower in your abdomen (lightening).  You may have increased vaginal discharge.  You may notice that you have pain around your pelvic bone as your uterus distends. Follow these instructions at home: Medicines  Follow your health care provider's instructions regarding medicine use. Specific medicines may be either safe or unsafe to take during pregnancy. Do not take any medicines unless approved by your health care provider.  Take a prenatal vitamin that contains at least 600 micrograms (mcg) of folic acid. Eating and drinking  Eat a healthy diet that includes fresh fruits and vegetables, whole grains, good sources of protein such as meat, eggs, or tofu, and low-fat  dairy products.  Avoid raw meat and unpasteurized juice, milk, and cheese. These carry germs that can harm you and your baby.  Eat 4 or 5 small meals rather than 3 large meals a day.  You may need to take these actions to prevent or treat constipation: ? Drink enough fluid to keep your urine pale yellow. ? Eat foods that are high in fiber, such as beans, whole grains, and fresh fruits and vegetables. ? Limit foods that are high in fat and processed sugars, such as fried or sweet foods. Activity  Exercise only as directed by your health care provider. Most people can continue their usual exercise routine during pregnancy. Try to exercise for 30 minutes at least 5 days a week. Stop exercising if you experience contractions in the uterus.  Stop exercising if you develop pain or cramping in the lower abdomen or lower back.  Avoid heavy lifting.  Do not exercise  if it is very hot or humid or if you are at a high altitude.  If you choose to, you may continue to have sex unless your health care provider tells you not to. Relieving pain and discomfort  Take frequent breaks and rest with your legs raised (elevated) if you have leg cramps or low back pain.  Take warm sitz baths to soothe any pain or discomfort caused by hemorrhoids. Use hemorrhoid cream if your health care provider approves.  Wear a supportive bra to prevent discomfort from breast tenderness.  If you develop varicose veins: ? Wear support hose as told by your health care provider. ? Elevate your feet for 15 minutes, 3-4 times a day. ? Limit salt in your diet. Safety  Talk to your health care provider before traveling far distances.  Do not use hot tubs, steam rooms, or saunas.  Wear your seat belt at all times when driving or riding in a car.  Talk with your health care provider if someone is verbally or physically abusive to you. Preparing for birth To prepare for the arrival of your baby:  Take prenatal classes  to understand, practice, and ask questions about labor and delivery.  Visit the hospital and tour the maternity area.  Purchase a rear-facing car seat and make sure you know how to install it in your car.  Prepare the baby's room or sleeping area. Make sure to remove all pillows and stuffed animals from the baby's crib to prevent suffocation. General instructions  Avoid cat litter boxes and soil used by cats. These carry germs that can cause birth defects in the baby. If you have a cat, ask someone to clean the litter box for you.  Do not douche or use tampons. Do not use scented sanitary pads.  Do not use any products that contain nicotine or tobacco, such as cigarettes, e-cigarettes, and chewing tobacco. If you need help quitting, ask your health care provider.  Do not use any herbal remedies, illegal drugs, or medicines that were not prescribed to you. Chemicals in these products can harm your baby.  Do not drink alcohol.  You will have more frequent prenatal exams during the third trimester. During a routine prenatal visit, your health care provider will do a physical exam, perform tests, and discuss your overall health. Keep all follow-up visits. This is important. Where to find more information  American Pregnancy Association: americanpregnancy.org  [employer]: https://www.todd-brady.net/  Office on [employer] Health: MightyReward.co.nz Contact a health care provider if you have:  A fever.  Mild pelvic cramps, pelvic pressure, or nagging pain in your abdominal area or lower back.  Vomiting or diarrhea.  Bad-smelling vaginal discharge or foul-smelling urine.  Pain when you urinate.  A headache that does not go away when you take medicine.  Visual changes or see spots in front of your eyes. Get help right away if:  Your water breaks.  You have regular contractions less than 5 minutes apart.  You have spotting  or bleeding from your vagina.  You have severe abdominal pain.  You have difficulty breathing.  You have chest pain.  You have fainting spells.  You have not felt your baby move for the time period told by your health care provider.  You have new or increased pain, swelling, or redness in an arm or leg. Summary  The third trimester of pregnancy is from week 28 through week 40 (months 7 through 9).  You may have  more problems sleeping. This can be caused by the size of your abdomen, an increased need to urinate, and an increase in your body's metabolism.  You will have more frequent prenatal exams during the third trimester. Keep all follow-up visits. This is important. This information is not intended to replace advice given to you by your health care provider. Make sure you discuss any questions you have with your health care provider. Document Revised: 04/20/2020 Document Reviewed: 02/25/2020 Elsevier Patient Education  2021 Elsevier Inc.         Oral Glucose Tolerance Test During Pregnancy Why am I having this test? The oral glucose tolerance test (OGTT) is done to check how your body processes blood sugar (glucose). This is one of several tests used to diagnose diabetes that develops during pregnancy (gestational diabetes mellitus). Gestational diabetes is a short-term form of diabetes that some women develop while they are pregnant. It usually occurs during the second trimester of pregnancy and goes away after delivery. Testing, or screening, for gestational diabetes usually occurs at weeks 24-28 of pregnancy. You may have the OGTT test after having a 1-hour glucose screening test if the results from that test indicate that you may have gestational diabetes. This test may also be needed if:  You have a history of gestational diabetes.  There is a history of giving birth to very large babies or of losing pregnancies (having stillbirths).  You have signs and symptoms of  diabetes, such as: ? Changes in your eyesight. ? Tingling or numbness in your hands or feet. ? Changes in hunger, thirst, and urination, and these are not explained by your pregnancy. What is being tested? This test measures the amount of glucose in your blood at different times during a period of 3 hours. This shows how well your body can process glucose. What kind of sample is taken? Blood samples are required for this test. They are usually collected by inserting a needle into a blood vessel.   How do I prepare for this test?  For 3 days before your test, eat normally. Have plenty of carbohydrate-rich foods.  Follow instructions from your health care provider about: ? Eating or drinking restrictions on the day of the test. You may be asked not to eat or drink anything other than water (to fast) starting 8-10 hours before the test. ? Changing or stopping your regular medicines. Some medicines may interfere with this test. Tell a health care provider about:  All medicines you are taking, including vitamins, herbs, eye drops, creams, and over-the-counter medicines.  Any blood disorders you have.  Any surgeries you have had.  Any medical conditions you have. What happens during the test? First, your blood glucose will be measured. This is referred to as your fasting blood glucose because you fasted before the test. Then, you will drink a glucose solution that contains a certain amount of glucose. Your blood glucose will be measured again 1, 2, and 3 hours after you drink the solution. This test takes about 3 hours to complete. You will need to stay at the testing location during this time. During the testing period:  Do not eat or drink anything other than the glucose solution.  Do not exercise.  Do not use any products that contain nicotine or tobacco, such as cigarettes, e-cigarettes, and chewing tobacco. These can affect your test results. If you need help quitting, ask your health  care provider. The testing procedure may vary among health care providers and hospitals.  How are the results reported? Your results will be reported as milligrams of glucose per deciliter of blood (mg/dL) or millimoles per liter (mmol/L). There is more than one source for screening and diagnosis reference values used to diagnose gestational diabetes. Your health care provider will compare your results to normal values that were established after testing a large group of people (reference values). Reference values may vary among labs and hospitals. For this test (Carpenter-Coustan), reference values are:  Fasting: 95 mg/dL (5.3 mmol/L).  1 hour: 180 mg/dL (06.3 mmol/L).  2 hour: 155 mg/dL (8.6 mmol/L).  3 hour: 140 mg/dL (7.8 mmol/L). What do the results mean? Results below the reference values are considered normal. If two or more of your blood glucose levels are at or above the reference values, you may be diagnosed with gestational diabetes. If only one level is high, your health care provider may suggest repeat testing or other tests to confirm a diagnosis. Talk with your health care provider about what your results mean. Questions to ask your health care provider Ask your health care provider, or the department that is doing the test:  When will my results be ready?  How will I get my results?  What are my treatment options?  What other tests do I need?  What are my next steps? Summary  The oral glucose tolerance test (OGTT) is one of several tests used to diagnose diabetes that develops during pregnancy (gestational diabetes mellitus). Gestational diabetes is a short-term form of diabetes that some women develop while they are pregnant.  You may have the OGTT test after having a 1-hour glucose screening test if the results from that test show that you may have gestational diabetes. You may also have this test if you have any symptoms or risk factors for this type of  diabetes.  Talk with your health care provider about what your results mean. This information is not intended to replace advice given to you by your health care provider. Make sure you discuss any questions you have with your health care provider. Document Revised: 04/21/2020 Document Reviewed: 04/21/2020 Elsevier Patient Education  2021 Elsevier Inc.       Tdap (Tetanus, Diphtheria, Pertussis) Vaccine: What You Need to Know 1. Why get vaccinated? Tdap vaccine can prevent tetanus, diphtheria, and pertussis. Diphtheria and pertussis spread from person to person. Tetanus enters the body through cuts or wounds.  TETANUS (T) causes painful stiffening of the muscles. Tetanus can lead to serious health problems, including being unable to open the mouth, having trouble swallowing and breathing, or death.  DIPHTHERIA (D) can lead to difficulty breathing, heart failure, paralysis, or death.  PERTUSSIS (aP), also known as "whooping cough," can cause uncontrollable, violent coughing that makes it hard to breathe, eat, or drink. Pertussis can be extremely serious especially in babies and young children, causing pneumonia, convulsions, brain damage, or death. In teens and adults, it can cause weight loss, loss of bladder control, passing out, and rib fractures from severe coughing. 2. Tdap vaccine Tdap is only for children 7 years and older, adolescents, and adults.  Adolescents should receive a single dose of Tdap, preferably at age 76 or 12 years. Pregnant people should get a dose of Tdap during every pregnancy, preferably during the early part of the third trimester, to help protect the newborn from pertussis. Infants are most at risk for severe, life-threatening complications from pertussis. Adults who have never received Tdap should get a dose of Tdap. Also, adults should  receive a booster dose of either Tdap or Td (a different vaccine that protects against tetanus and diphtheria but not  pertussis) every 10 years, or after 5 years in the case of a severe or dirty wound or burn. Tdap may be given at the same time as other vaccines. 3. Talk with your health care provider Tell your vaccine provider if the person getting the vaccine:  Has had an allergic reaction after a previous dose of any vaccine that protects against tetanus, diphtheria, or pertussis, or has any severe, life-threatening allergies  Has had a coma, decreased level of consciousness, or prolonged seizures within 7 days after a previous dose of any pertussis vaccine (DTP, DTaP, or Tdap)  Has seizures or another nervous system problem  Has ever had Guillain-Barr Syndrome (also called "GBS")  Has had severe pain or swelling after a previous dose of any vaccine that protects against tetanus or diphtheria In some cases, your health care provider may decide to postpone Tdap vaccination until a future visit. People with minor illnesses, such as a cold, may be vaccinated. People who are moderately or severely ill should usually wait until they recover before getting Tdap vaccine.  Your health care provider can give you more information. 4. Risks of a vaccine reaction  Pain, redness, or swelling where the shot was given, mild fever, headache, feeling tired, and nausea, vomiting, diarrhea, or stomachache sometimes happen after Tdap vaccination. People sometimes faint after medical procedures, including vaccination. Tell your provider if you feel dizzy or have vision changes or ringing in the ears.  As with any medicine, there is a very remote chance of a vaccine causing a severe allergic reaction, other serious injury, or death. 5. What if there is a serious problem? An allergic reaction could occur after the vaccinated person leaves the clinic. If you see signs of a severe allergic reaction (hives, swelling of the face and throat, difficulty breathing, a fast heartbeat, dizziness, or weakness), call 9-1-1 and get the  person to the nearest hospital. For other signs that concern you, call your health care provider.  Adverse reactions should be reported to the Vaccine Adverse Event Reporting System (VAERS). Your health care provider will usually file this report, or you can do it yourself. Visit the VAERS website at www.vaers.LAgents.nohhs.gov or call 95933919541-331-740-4256. VAERS is only for reporting reactions, and VAERS staff members do not give medical advice. 6. The National Vaccine Injury Compensation Program The [employer] Vaccine Injury Compensation Program (VICP) is a federal program that was created to compensate people who may have been injured by certain vaccines. Claims regarding alleged injury or death due to vaccination have a time limit for filing, which may be as short as two years. Visit the VICP website at SpiritualWord.atwww.hrsa.gov/vaccinecompensation or call (301) 788-17181-952-841-3273 to learn about the program and about filing a claim. 7. How can I learn more?  Ask your health care provider.  Call your local or state health department.  Visit the website of the Food and Drug Administration (FDA) for vaccine package inserts and additional information at FinderList.nowww.fda.gov/vaccines-blood-biologics/vaccines.  Contact the Centers for Disease Control and Prevention (CDC): ? Call 513 215 22961-847-717-6256 (1-800-CDC-INFO) or ? Visit CDC's website at PicCapture.uywww.cdc.gov/vaccines. Vaccine Information Statement Tdap (Tetanus, Diphtheria, Pertussis) Vaccine (07/01/2020) This information is not intended to replace advice given to you by your health care provider. Make sure you discuss any questions you have with your health care provider. Document Revised: 07/27/2020 Document Reviewed: 07/27/2020 Elsevier Patient Education  2021 [employer]  Exercise During Pregnancy Exercise is an important part of being healthy for people of all ages. Exercise improves the function of your heart and lungs and helps you maintain strength, flexibility, and a healthy body  weight. Exercise also boosts energy levels and elevates mood. Most women should exercise regularly during pregnancy. Exercise routines may need to change as your pregnancy progresses. In rare cases, women with certain medical conditions or complications may be asked to limit or avoid exercise during pregnancy. Your health care provider will give you information on what will work for you. How does this affect me? Along with maintaining general strength and flexibility, exercising during pregnancy can help:  Keep strength in muscles that are used during labor and childbirth.  Decrease low back pain or symptoms of depression.  Control weight gain during pregnancy.  Reduce the risk of needing insulin if you develop diabetes during pregnancy.  Decrease the risk of cesarean delivery.  Speed up your recovery after giving birth.  Relieve constipation. How does this affect my baby? Exercise can help you have a healthy pregnancy. Exercise does not cause early (premature) birth. It will not cause your baby to weigh less at birth. What exercises can I do? Many exercises are safe for you to do during pregnancy. Do a variety of exercises that safely increase your heart and breathing rates and help you build and maintain muscle strength. Do exercises exactly as told by your health care provider. You may do these exercises:  Walking.  Swimming.  Water aerobics.  Riding a stationary bike.  Modified yoga or Pilates. Tell your instructor that you are pregnant. Avoid overstretching, and avoid lying on your back for long periods of time.  Running or jogging. Choose this type of exercise only if: ? You ran or jogged regularly before your pregnancy. ? You can run or jog and still talk in complete sentences.   What exercises should I avoid? You may be told to limit high-intensity exercise depending on your level of fitness and whether you exercised regularly before you were pregnant. You can tell that  you are exercising at a high intensity if you are breathing much harder and faster and cannot hold a conversation while exercising. You must avoid:  Contact sports.  Activities that put you at risk for falling on or being hit in the belly, such as downhill skiing, waterskiing, surfing, rock climbing, cycling, gymnastics, and horseback riding.  Scuba diving.  Skydiving.  Hot yoga or hot Pilates. These activities take place in a room that is heated to high temperatures.  Jogging or running, unless you jogged or ran regularly before you were pregnant. While jogging or running, you should always be able to talk in full sentences. Do not run or jog so fast that you are unable to have a conversation.  Do not exercise at more than 6,000 feet above sea level (high elevation) if you are not used to exercising at high elevation. How do I exercise in a safe way?  Avoid overheating. Do not exercise in very high temperatures.  Wear loose-fitting, breathable clothes.  Avoid dehydration. Drink enough fluid before, during, and after exercise to keep your urine pale yellow.  Avoid overstretching. Because of hormone changes during pregnancy, it is easy to overstretch muscles, tendons, and ligaments.  Start slowly and ask your health care provider to recommend the types of exercise that are safe for you.  Do not exercise to lose weight.  Wear a sports bra to support your breasts.  Avoid standing still or lying flat on your back as much as you can.   Follow these instructions at home:  Exercise on most days or all days of the week. Try to exercise for 30 minutes a day, 5 days a week, unless your health care provider tells you not to.  If you actively exercised before your pregnancy and you are healthy, your health care provider may tell you to continue to do moderate-intensity to high-intensity exercise.  If you are just starting to exercise or did not exercise much before your pregnancy, your  health care provider may tell you to do low-intensity to moderate-intensity exercise. Questions to ask your health care provider  Is exercise safe for me?  What are signs that I should stop exercising?  Does my health condition mean that I should not exercise during pregnancy?  When should I avoid exercising during pregnancy? Stop exercising and contact a health care provider if: You have any unusual symptoms such as:  Mild contractions of the uterus or cramps in the abdomen.  A dizzy feeling that does not go away when you rest. Stop exercising and get help right away if: You have any unusual symptoms such as:  Sudden, severe pain in your low back or your belly.  Regular, painful contractions of your uterus.  Chest pain.  Bleeding or fluid leaking from your vagina.  Shortness of breath.  Headache.  Pain and swelling of your calves. Summary  Most women should exercise regularly throughout pregnancy. In rare cases, women with certain medical conditions or complications may be asked to limit or avoid exercise during pregnancy.  Do not exercise to lose weight during pregnancy.  Your health care provider will tell you what level of physical activity is right for you.  Stop exercising and contact a health care provider if you have unusual symptoms, such as mild contractions or dizziness. This information is not intended to replace advice given to you by your health care provider. Make sure you discuss any questions you have with your health care provider. Document Revised: 06/29/2020 Document Reviewed: 06/29/2020 Elsevier Patient Education  2021 [employer].

## 2021-01-04 NOTE — Progress Notes (Signed)
+   Fetal movement. Pt c/o weight gain and lower back pain.

## 2021-01-04 NOTE — Progress Notes (Signed)
Subjective:  Christine Ibarra is a 45 y.o. G2P1001 at [redacted]w[redacted]d being seen today for ongoing prenatal care.  She is currently monitored for the following issues for this low-risk pregnancy and has Late prenatal care affecting pregnancy in second trimester; Supervision of other normal pregnancy, antepartum; Low-lying placenta; History of cesarean delivery; Back pain in pregnancy; and Excessive weight gain during pregnancy in second trimester on their problem list.  Patient reports backache.  Contractions: Irritability. Vag. Bleeding: None.  Movement: Present. Denies leaking of fluid.   The following portions of the patient's history were reviewed and updated as appropriate: allergies, current medications, past family history, past medical history, past social history, past surgical history and problem list. Problem list updated.  Objective:   Vitals:   01/04/21 0939  BP: 104/65  Pulse: 90  Weight: 153 lb 9.6 oz (69.7 kg)    Fetal Status: Fetal Heart Rate (bpm): 152 Fundal Height: 24 cm Movement: Present     General:  Alert, oriented and cooperative. Patient is in no acute distress.  Skin: Skin is warm and dry. No rash noted.   Cardiovascular: Normal heart rate noted  Respiratory: Normal respiratory effort, no problems with respiration noted  Abdomen: Soft, gravid, appropriate for gestational age. Pain/Pressure: Absent     Pelvic: Vag. Bleeding: None     Cervical exam deferred        Extremities: Normal range of motion.  Edema: None  Mental Status: Normal mood and affect. Normal behavior. Normal judgment and thought content.   Urinalysis:      Assessment and Plan:  Pregnancy: G2P1001 at 110w6d  1. Supervision of other normal pregnancy, antepartum -GTT/labs next visit  2. Late prenatal care affecting pregnancy in second trimester  3. [redacted] weeks gestation of pregnancy  4. Low-lying placenta -f/u US scheduled 01/11/2021  5. History of cesarean delivery x1  6. Back pain in  pregnancy - pt wearing pregnancy support belt which she reports helps - pt stopped exercising because she was not sure what she could do during pregnancy, discussed exercise in pregnancy, information given - pt requests referral to chiropractic and pelvic PT for back pain, has used chiropractor in the past with success - Ambulatory referral to Chiropractic - AMB referral to rehabilitation  7. Excessive weight gain during pregnancy in second trimester - Referral to Nutrition and Diabetes Services   Preterm labor symptoms and general obstetric precautions including but not limited to vaginal bleeding, contractions, leaking of fluid and fetal movement were reviewed in detail with the patient. I discussed the assessment and treatment plan with the patient. The patient was provided an opportunity to ask questions and all were answered. The patient agreed with the plan and demonstrated an understanding of the instructions. The patient was advised to call back or seek an in-person office evaluation/go to [facility] at [facility] for any urgent or concerning symptoms. Please refer to After Visit Summary for other counseling recommendations.  Return in about 4 weeks (around 02/01/2021) for in-person HOB/APP OK/GTT/labs.   Leona Alen, Odie Sera, NP

## 2021-01-05 ENCOUNTER — Other Ambulatory Visit: Payer: 59

## 2021-01-11 ENCOUNTER — Other Ambulatory Visit: Payer: Self-pay | Admitting: *Deleted

## 2021-01-11 ENCOUNTER — Encounter: Payer: Self-pay | Admitting: *Deleted

## 2021-01-11 ENCOUNTER — Ambulatory Visit: Payer: Medicaid Other | Admitting: *Deleted

## 2021-01-11 ENCOUNTER — Other Ambulatory Visit: Payer: Self-pay

## 2021-01-11 ENCOUNTER — Ambulatory Visit: Payer: Medicaid Other | Attending: Obstetrics and Gynecology

## 2021-01-11 DIAGNOSIS — Z3687 Encounter for antenatal screening for uncertain dates: Secondary | ICD-10-CM

## 2021-01-11 DIAGNOSIS — O09522 Supervision of elderly multigravida, second trimester: Secondary | ICD-10-CM

## 2021-01-11 DIAGNOSIS — Z98891 History of uterine scar from previous surgery: Secondary | ICD-10-CM | POA: Diagnosis present

## 2021-01-11 DIAGNOSIS — O0932 Supervision of pregnancy with insufficient antenatal care, second trimester: Secondary | ICD-10-CM | POA: Diagnosis not present

## 2021-01-11 DIAGNOSIS — O368390 Maternal care for abnormalities of the fetal heart rate or rhythm, unspecified trimester, not applicable or unspecified: Secondary | ICD-10-CM

## 2021-01-11 DIAGNOSIS — Z3A25 25 weeks gestation of pregnancy: Secondary | ICD-10-CM

## 2021-01-11 DIAGNOSIS — O34219 Maternal care for unspecified type scar from previous cesarean delivery: Secondary | ICD-10-CM

## 2021-01-11 DIAGNOSIS — O4440 Low lying placenta NOS or without hemorrhage, unspecified trimester: Secondary | ICD-10-CM

## 2021-01-19 ENCOUNTER — Other Ambulatory Visit: Payer: 59

## 2021-01-25 ENCOUNTER — Ambulatory Visit: Payer: Medicaid Other | Attending: Obstetrics

## 2021-01-25 ENCOUNTER — Ambulatory Visit: Payer: Medicaid Other | Admitting: *Deleted

## 2021-01-25 ENCOUNTER — Other Ambulatory Visit: Payer: Self-pay

## 2021-01-25 ENCOUNTER — Encounter: Payer: Self-pay | Admitting: *Deleted

## 2021-01-25 DIAGNOSIS — Z3A27 27 weeks gestation of pregnancy: Secondary | ICD-10-CM

## 2021-01-25 DIAGNOSIS — Z98891 History of uterine scar from previous surgery: Secondary | ICD-10-CM | POA: Insufficient documentation

## 2021-01-25 DIAGNOSIS — O368390 Maternal care for abnormalities of the fetal heart rate or rhythm, unspecified trimester, not applicable or unspecified: Secondary | ICD-10-CM | POA: Insufficient documentation

## 2021-01-25 DIAGNOSIS — O0932 Supervision of pregnancy with insufficient antenatal care, second trimester: Secondary | ICD-10-CM | POA: Diagnosis not present

## 2021-01-25 DIAGNOSIS — O4440 Low lying placenta NOS or without hemorrhage, unspecified trimester: Secondary | ICD-10-CM

## 2021-01-25 DIAGNOSIS — O368320 Maternal care for abnormalities of the fetal heart rate or rhythm, second trimester, not applicable or unspecified: Secondary | ICD-10-CM | POA: Diagnosis not present

## 2021-01-25 DIAGNOSIS — O09522 Supervision of elderly multigravida, second trimester: Secondary | ICD-10-CM

## 2021-01-26 ENCOUNTER — Ambulatory Visit: Payer: Medicaid Other | Admitting: Physical Therapy

## 2021-02-01 ENCOUNTER — Encounter: Payer: Self-pay | Admitting: Obstetrics and Gynecology

## 2021-02-01 ENCOUNTER — Other Ambulatory Visit: Payer: Self-pay

## 2021-02-01 ENCOUNTER — Other Ambulatory Visit: Payer: Medicaid Other

## 2021-02-01 ENCOUNTER — Ambulatory Visit (INDEPENDENT_AMBULATORY_CARE_PROVIDER_SITE_OTHER): Payer: Medicaid Other | Admitting: Obstetrics and Gynecology

## 2021-02-01 VITALS — BP 110/76 | HR 88 | Wt 157.8 lb

## 2021-02-01 DIAGNOSIS — Z98891 History of uterine scar from previous surgery: Secondary | ICD-10-CM

## 2021-02-01 DIAGNOSIS — Z3480 Encounter for supervision of other normal pregnancy, unspecified trimester: Secondary | ICD-10-CM

## 2021-02-01 DIAGNOSIS — O2602 Excessive weight gain in pregnancy, second trimester: Secondary | ICD-10-CM

## 2021-02-01 DIAGNOSIS — Z3A28 28 weeks gestation of pregnancy: Secondary | ICD-10-CM | POA: Insufficient documentation

## 2021-02-01 DIAGNOSIS — M549 Dorsalgia, unspecified: Secondary | ICD-10-CM

## 2021-02-01 DIAGNOSIS — O99891 Other specified diseases and conditions complicating pregnancy: Secondary | ICD-10-CM

## 2021-02-01 DIAGNOSIS — O4440 Low lying placenta NOS or without hemorrhage, unspecified trimester: Secondary | ICD-10-CM

## 2021-02-01 DIAGNOSIS — O0932 Supervision of pregnancy with insufficient antenatal care, second trimester: Secondary | ICD-10-CM

## 2021-02-01 DIAGNOSIS — O283 Abnormal ultrasonic finding on antenatal screening of mother: Secondary | ICD-10-CM

## 2021-02-01 NOTE — Progress Notes (Signed)
PRENATAL VISIT NOTE  Subjective:  Christine Ibarra is a 45 y.o. G2P1001 at [redacted]w[redacted]d being seen today for ongoing prenatal care.  She is currently monitored for the following issues for this high-risk pregnancy and has Late prenatal care affecting pregnancy in second trimester; Supervision of other normal pregnancy, antepartum; Low-lying placenta; History of cesarean delivery; Back pain in pregnancy; Excessive weight gain during pregnancy in second trimester; and [redacted] weeks gestation of pregnancy on their problem list.  Patient reports no complaints. Back pain improved from prior with use of belly band.  Contractions: Not present. Vag. Bleeding: None.  Movement: Present. Denies leaking of fluid.   The following portions of the patient's history were reviewed and updated as appropriate: allergies, current medications, past family history, past medical history, past social history, past surgical history and problem list.   Objective:   Vitals:   02/01/21 0827  BP: 110/76  Pulse: 88  Weight: 157 lb 12.8 oz (71.6 kg)    Fetal Status: Fetal Heart Rate (bpm): 144 Fundal Height: 28 cm Movement: Present     General:  Alert, oriented and cooperative. Patient is in no acute distress.  Skin: Skin is warm and dry. No rash noted.   Cardiovascular: Normal heart rate noted  Respiratory: Normal respiratory effort, no problems with respiration noted  Abdomen: Soft, gravid, appropriate for gestational age.  Pain/Pressure: Absent     Pelvic: Cervical exam deferred        Extremities: Normal range of motion.  Edema: None  Mental Status: Normal mood and affect. Normal behavior. Normal judgment and thought content.   Assessment and Plan:  Pregnancy: G2P1001 at [redacted]w[redacted]d 1. [redacted] weeks gestation of pregnancy, Supervision of other normal pregnancy, antepartum: No red flag symptoms/signs today. +FHTs. Appropriate fundal height. Normal BP. Low risk girl. Considering Nexplanon given mood/irregular bleeding issues with  Mirena prior. - 2hr gtt, CBC, RPR, HIV today - pt declines tdap, flu vaccines s/p counseling - f/u US on 3/16 given finding of fetal PACs - rtc 2 weeks or sooner for return precautions as noted  2. Low-lying placenta: Resolved on recent ultrasound at [redacted]w[redacted]d.  3. History of cesarean delivery: Pt reports prior Cesarean x1 in [place], [place]. Pt reports laboring for ~18 hours prior to Cesarean. States she "dilated to 2 fingers apart". Pt hesitant to "try again" given that "this baby feels larger than her first". Pt previously completed records request but no outside op report in media tab.  - pt considering TOLAC; undecided at this time given her concern of prior arrest of dilation - awaiting operative report from first Cesarean - re-address pt's desire for TOLAC at f/u appt & ensure outside records received  4. Back pain in pregnancy  Excessive weight gain in pregnancy: Pt referred to PT/chiropractic at last appt. Improvement with use of belly band. No red flag symptoms/signs. Weight up 4lb in past 4 weeks. - encouraged continued healthy lifestyle modifications with healthy eating & regular exercise - encouraged continued use of belly band prn  5. Late prenatal care affecting pregnancy in second trimester - plan for postpartum SW consult  6. Abnormal fetal ultrasound: finding of fetal PACs without evidence of hydrops or SVT. - discussed with pt today; plan for f/u US on 3/16  Preterm labor symptoms and general obstetric precautions including but not limited to vaginal bleeding, contractions, leaking of fluid and fetal movement were reviewed in detail with the patient. Please refer to After Visit Summary for other counseling recommendations.   Return  in about 2 weeks (around 02/15/2021) for f/u prenatal appt, in person, any provider.  Future Appointments  Date Time Provider Department Center  02/08/2021  2:30 PM [facility] NURSE [facility]  02/08/2021  2:45 PM [facility]   02/15/2021  1:40 PM [facility], Wilmer Floor, CNM [facility] None   Sheila Oats, MD OB Fellow, [facility] 02/01/2021 1:20 PM

## 2021-02-01 NOTE — Progress Notes (Signed)
Declined tdap

## 2021-02-01 NOTE — Patient Instructions (Addendum)
-We did your test for gestational diabetes today. It will be important to follow-up up promptly if this test is abnormal. - Continue to wear your belly band as needed for additional back support.  Second Trimester of Pregnancy  The second trimester of pregnancy is from week 13 through week 27. This is also called months 4 through 6 of pregnancy. This is often the time when you feel your best. During the second trimester:  Morning sickness is less or has stopped.  You may have more energy.  You may feel hungry more often. At this time, your unborn baby (fetus) is growing very fast. At the end of the sixth month, the unborn baby may be up to 12 inches long and weigh about 1 pounds. You will likely start to feel the baby move between 16 and 20 weeks of pregnancy. Body changes during your second trimester Your body continues to go through many changes during this time. The changes vary and generally return to normal after the baby is born. Physical changes  You will gain more weight.  You may start to get stretch marks on your hips, belly (abdomen), and breasts.  Your breasts will grow and may hurt.  Dark spots or blotches may develop on your face.  A dark line from your belly button to the pubic area (linea nigra) may appear.  You may have changes in your hair. Health changes  You may have headaches.  You may have heartburn.  You may have trouble pooping (constipation).  You may have hemorrhoids or swollen, bulging veins (varicose veins).  Your gums may bleed.  You may pee (urinate) more often.  You may have back pain. Follow these instructions at home: Medicines  Take over-the-counter and prescription medicines only as told by your doctor. Some medicines are not safe during pregnancy.  Take a prenatal vitamin that contains at least 600 micrograms (mcg) of folic acid. Eating and drinking  Eat healthy meals that include: ? Fresh fruits and vegetables. ? Whole  grains. ? Good sources of protein, such as meat, eggs, or tofu. ? Low-fat dairy products.  Avoid raw meat and unpasteurized juice, milk, and cheese.  You may need to take these actions to prevent or treat trouble pooping: ? Drink enough fluids to keep your pee (urine) pale yellow. ? Eat foods that are high in fiber. These include beans, whole grains, and fresh fruits and vegetables. ? Limit foods that are high in fat and sugar. These include fried or sweet foods. Activity  Exercise only as told by your doctor. Most people can do their usual exercise during pregnancy. Try to exercise for 30 minutes at least 5 days a week.  Stop exercising if you have pain or cramps in your belly or lower back.  Do not exercise if it is too hot or too humid, or if you are in a place of great height (high altitude).  Avoid heavy lifting.  If you choose to, you may have sex unless your doctor tells you not to. Relieving pain and discomfort  Wear a good support bra if your breasts are sore.  Take warm water baths (sitz baths) to soothe pain or discomfort caused by hemorrhoids. Use hemorrhoid cream if your doctor approves.  Rest with your legs raised (elevated) if you have leg cramps or low back pain.  If you develop bulging veins in your legs: ? Wear support hose as told by your doctor. ? Raise your feet for 15 minutes, 3-4 times  a day. ? Limit salt in your food. Safety  Wear your seat belt at all times when you are in a car.  Talk with your doctor if someone is hurting you or yelling at you a lot. Lifestyle  Do not use hot tubs, steam rooms, or saunas.  Do not douche. Do not use tampons or scented sanitary pads.  Avoid cat litter boxes and soil used by cats. These carry germs that can harm your baby and can cause a loss of your baby by miscarriage or stillbirth.  Do not use herbal medicines, illegal drugs, or medicines that are not approved by your doctor. Do not drink alcohol.  Do not  smoke or use any products that contain nicotine or tobacco. If you need help quitting, ask your doctor. General instructions  Keep all follow-up visits. This is important.  Ask your doctor about local prenatal classes.  Ask your doctor about the right foods to eat or for help finding a counselor. Where to find more information  American Pregnancy Association: americanpregnancy.org  [employer]: www.acog.org  Office on [employer] Health: MightyReward.co.nz Contact a doctor if:  You have a headache that does not go away when you take medicine.  You have changes in how you see, or you see spots in front of your eyes.  You have mild cramps, pressure, or pain in your lower belly.  You continue to feel like you may vomit (nauseous), you vomit, or you have watery poop (diarrhea).  You have bad-smelling fluid coming from your vagina.  You have pain when you pee or your pee smells bad.  You have very bad swelling of your face, hands, ankles, feet, or legs.  You have a fever. Get help right away if:  You are leaking fluid from your vagina.  You have spotting or bleeding from your vagina.  You have very bad belly cramping or pain.  You have trouble breathing.  You have chest pain.  You faint.  You have not felt your baby move for the time period told by your doctor.  You have new or increased pain, swelling, or redness in an arm or leg. Summary  The second trimester of pregnancy is from week 13 through week 27 (months 4 through 6).  Eat healthy meals.  Exercise as told by your doctor. Most people can do their usual exercise during pregnancy.  Do not use herbal medicines, illegal drugs, or medicines that are not approved by your doctor. Do not drink alcohol.  Call your doctor if you get sick or if you notice anything unusual about your pregnancy. This information is not intended to replace advice given to you by your health  care provider. Make sure you discuss any questions you have with your health care provider. Document Revised: 04/20/2020 Document Reviewed: 02/25/2020 Elsevier Patient Education  2021 Elsevier Inc.   Fetal Movement Counts Patient Name: ________________________________________________ Patient Due Date: ____________________  What is a fetal movement count? A fetal movement count is the number of times that you feel your baby move during a certain amount of time. This may also be called a fetal kick count. A fetal movement count is recommended for every pregnant woman. You may be asked to start counting fetal movements as early as week 28 of your pregnancy. Pay attention to when your baby is most active. You may notice your baby's sleep and wake cycles. You may also notice things that make your baby move more. You should do  a fetal movement count:  When your baby is normally most active.  At the same time each day. A good time to count movements is while you are resting, after having something to eat and drink. How do I count fetal movements? 1. Find a quiet, comfortable area. Sit, or lie down on your side. 2. Write down the date, the start time and stop time, and the number of movements that you felt between those two times. Take this information with you to your health care visits. 3. Write down your start time when you feel the first movement. 4. Count kicks, flutters, swishes, rolls, and jabs. You should feel at least 10 movements. 5. You may stop counting after you have felt 10 movements, or if you have been counting for 2 hours. Write down the stop time. 6. If you do not feel 10 movements in 2 hours, contact your health care provider for further instructions. Your health care provider may want to do additional tests to assess your baby's well-being. Contact a health care provider if:  You feel fewer than 10 movements in 2 hours.  Your baby is not moving like he or she usually  does. Date: ____________ Start time: ____________ Stop time: ____________ Movements: ____________ Date: ____________ Start time: ____________ Stop time: ____________ Movements: ____________ Date: ____________ Start time: ____________ Stop time: ____________ Movements: ____________ Date: ____________ Start time: ____________ Stop time: ____________ Movements: ____________ Date: ____________ Start time: ____________ Stop time: ____________ Movements: ____________ Date: ____________ Start time: ____________ Stop time: ____________ Movements: ____________ Date: ____________ Start time: ____________ Stop time: ____________ Movements: ____________ Date: ____________ Start time: ____________ Stop time: ____________ Movements: ____________ Date: ____________ Start time: ____________ Stop time: ____________ Movements: ____________ This information is not intended to replace advice given to you by your health care provider. Make sure you discuss any questions you have with your health care provider. Document Revised: 07/02/2019 Document Reviewed: 07/02/2019 Elsevier Patient Education  2021 [employer].

## 2021-02-02 LAB — CBC
Hematocrit: 31.7 % — ABNORMAL LOW (ref 34.0–46.6)
Hemoglobin: 10.4 g/dL — ABNORMAL LOW (ref 11.1–15.9)
MCH: 30.5 pg (ref 26.6–33.0)
MCHC: 32.8 g/dL (ref 31.5–35.7)
MCV: 93 fL (ref 79–97)
Platelets: 229 10*3/uL (ref 150–450)
RBC: 3.41 x10E6/uL — ABNORMAL LOW (ref 3.77–5.28)
RDW: 13 % (ref 11.7–15.4)
WBC: 8 10*3/uL (ref 3.4–10.8)

## 2021-02-02 LAB — GLUCOSE TOLERANCE, 2 HOURS W/ 1HR
Glucose, 1 hour: 167 mg/dL (ref 65–179)
Glucose, 2 hour: 108 mg/dL (ref 65–152)
Glucose, Fasting: 75 mg/dL (ref 65–91)

## 2021-02-02 LAB — RPR: RPR Ser Ql: NONREACTIVE

## 2021-02-02 LAB — HIV ANTIBODY (ROUTINE TESTING W REFLEX): HIV Screen 4th Generation wRfx: NONREACTIVE

## 2021-02-04 ENCOUNTER — Telehealth: Payer: Self-pay | Admitting: Obstetrics and Gynecology

## 2021-02-04 DIAGNOSIS — O99013 Anemia complicating pregnancy, third trimester: Secondary | ICD-10-CM

## 2021-02-04 MED ORDER — FERROUS SULFATE 324 (65 FE) MG PO TBEC: 1.0000 | DELAYED_RELEASE_TABLET | ORAL | 3 refills | Status: DC

## 2021-02-04 NOTE — Telephone Encounter (Signed)
Sent my chart message to pt regarding recent lab results. Given anemia, will send prescription for po iron supplement every other day. Pt encouraged to call clinic if any questions or concerns.  Sheila Oats, MD OB Fellow, [facility] 02/04/2021 10:23 PM

## 2021-02-08 ENCOUNTER — Ambulatory Visit: Payer: Medicaid Other | Attending: Obstetrics

## 2021-02-08 ENCOUNTER — Encounter: Payer: Self-pay | Admitting: *Deleted

## 2021-02-08 ENCOUNTER — Other Ambulatory Visit: Payer: Self-pay

## 2021-02-08 ENCOUNTER — Ambulatory Visit: Payer: Medicaid Other | Admitting: *Deleted

## 2021-02-08 DIAGNOSIS — O368330 Maternal care for abnormalities of the fetal heart rate or rhythm, third trimester, not applicable or unspecified: Secondary | ICD-10-CM

## 2021-02-08 DIAGNOSIS — O4440 Low lying placenta NOS or without hemorrhage, unspecified trimester: Secondary | ICD-10-CM

## 2021-02-08 DIAGNOSIS — Z98891 History of uterine scar from previous surgery: Secondary | ICD-10-CM

## 2021-02-08 DIAGNOSIS — Z3A29 29 weeks gestation of pregnancy: Secondary | ICD-10-CM

## 2021-02-08 DIAGNOSIS — O368390 Maternal care for abnormalities of the fetal heart rate or rhythm, unspecified trimester, not applicable or unspecified: Secondary | ICD-10-CM | POA: Insufficient documentation

## 2021-02-08 DIAGNOSIS — O0933 Supervision of pregnancy with insufficient antenatal care, third trimester: Secondary | ICD-10-CM

## 2021-02-08 DIAGNOSIS — O34219 Maternal care for unspecified type scar from previous cesarean delivery: Secondary | ICD-10-CM

## 2021-02-08 DIAGNOSIS — O09523 Supervision of elderly multigravida, third trimester: Secondary | ICD-10-CM | POA: Diagnosis not present

## 2021-02-09 ENCOUNTER — Other Ambulatory Visit: Payer: Self-pay | Admitting: *Deleted

## 2021-02-09 DIAGNOSIS — O0933 Supervision of pregnancy with insufficient antenatal care, third trimester: Secondary | ICD-10-CM

## 2021-02-15 ENCOUNTER — Ambulatory Visit (INDEPENDENT_AMBULATORY_CARE_PROVIDER_SITE_OTHER): Payer: Medicaid Other | Admitting: Obstetrics and Gynecology

## 2021-02-15 ENCOUNTER — Other Ambulatory Visit: Payer: Self-pay

## 2021-02-15 VITALS — BP 106/69 | HR 96 | Wt 162.0 lb

## 2021-02-15 DIAGNOSIS — Z3480 Encounter for supervision of other normal pregnancy, unspecified trimester: Secondary | ICD-10-CM

## 2021-02-15 NOTE — Progress Notes (Signed)
ROB wants to discuss birth options

## 2021-02-15 NOTE — Progress Notes (Signed)
   PRENATAL VISIT NOTE  Subjective:  Christine Ibarra is a 45 y.o. G2P1001 at [redacted]w[redacted]d being seen today for ongoing prenatal care.  She is currently monitored for the following issues for this high-risk pregnancy and has Late prenatal care affecting pregnancy in second trimester; Supervision of other normal pregnancy, antepartum; Low-lying placenta; History of cesarean delivery; Back pain in pregnancy; Excessive weight gain during pregnancy in second trimester; [redacted] weeks gestation of pregnancy; and Abnormal fetal ultrasound on their problem list.  Patient reports no complaints.  Contractions: Not present. Vag. Bleeding: None.  Movement: Present. Denies leaking of fluid. Some irregular heart rate since taking cough medication/ Covid back in November. She has no dizziness, or chest pain. No symptoms just feels HR different at time.   The following portions of the patient's history were reviewed and updated as appropriate: allergies, current medications, past family history, past medical history, past social history, past surgical history and problem list.   Objective:   Vitals:   02/15/21 1400  BP: 106/69  Pulse: 96  Weight: 162 lb (73.5 kg)    Fetal Status: Fetal Heart Rate (bpm): 143 Fundal Height: 31 cm Movement: Present     General:  Alert, oriented and cooperative. Patient is in no acute distress.  Skin: Skin is warm and dry. No rash noted.   Cardiovascular: Normal heart rate noted  Respiratory: Normal respiratory effort, no problems with respiration noted  Abdomen: Soft, gravid, appropriate for gestational age.  Pain/Pressure: Absent     Pelvic: Cervical exam deferred        Extremities: Normal range of motion.  Edema: Trace  Mental Status: Normal mood and affect. Normal behavior. Normal judgment and thought content.   Assessment and Plan:  Pregnancy: G2P1001 at [redacted]w[redacted]d  1. Supervision of other normal pregnancy, antepartum  Fetal arrhythmia noted on doppler NST today: Baseline 135  bpm, 15x15 accels, no decels. Hx of fetal PAC's per MFM. AMA: start weekly antenatal testing, f/u with MFM Q4 weeks for growth [place]  Discuss delivery method with MD. Hx of low uterine transverse c/s Increase oral fluid intake, change positions slowly.   There are no diagnoses linked to this encounter. Preterm labor symptoms and general obstetric precautions including but not limited to vaginal bleeding, contractions, leaking of fluid and fetal movement were reviewed in detail with the patient. Please refer to After Visit Summary for other counseling recommendations.   Return in about 2 weeks (around 03/01/2021), or 2 weeks with MD to discuss Tolac vs repeat C/S.  Future Appointments  Date Time Provider Department Center  03/08/2021  2:15 PM [facility] NURSE [facility]  03/08/2021  2:30 PM [facility] US3 [facility]    Venia Carbon, NP

## 2021-02-15 NOTE — Patient Instructions (Signed)

## 2021-03-01 ENCOUNTER — Ambulatory Visit (INDEPENDENT_AMBULATORY_CARE_PROVIDER_SITE_OTHER): Payer: Medicaid Other | Admitting: Obstetrics and Gynecology

## 2021-03-01 ENCOUNTER — Other Ambulatory Visit: Payer: Self-pay

## 2021-03-01 ENCOUNTER — Encounter: Payer: Self-pay | Admitting: Obstetrics and Gynecology

## 2021-03-01 VITALS — BP 100/65 | HR 94 | Wt 163.0 lb

## 2021-03-01 DIAGNOSIS — Z3480 Encounter for supervision of other normal pregnancy, unspecified trimester: Secondary | ICD-10-CM

## 2021-03-01 DIAGNOSIS — O283 Abnormal ultrasonic finding on antenatal screening of mother: Secondary | ICD-10-CM

## 2021-03-01 DIAGNOSIS — Z98891 History of uterine scar from previous surgery: Secondary | ICD-10-CM

## 2021-03-01 NOTE — Progress Notes (Addendum)
ROB, discuss VBAC.  C/o RQ pain, umbilical hernia, varicose veins, pelvic pain 7-8/10.

## 2021-03-01 NOTE — Progress Notes (Signed)
Subjective:  Christine Ibarra is a 45 y.o. G2P1001 at [redacted]w[redacted]d being seen today for ongoing prenatal care.  She is currently monitored for the following issues for this high-risk pregnancy and has Late prenatal care affecting pregnancy in second trimester; Supervision of other normal pregnancy, antepartum; History of cesarean delivery; Back pain in pregnancy; Excessive weight gain during pregnancy in second trimester; and Abnormal fetal ultrasound on their problem list.  Patient reports general discomforts of pregnancy.  Contractions: Irritability. Vag. Bleeding: None.  Movement: Present. Denies leaking of fluid.   The following portions of the patient's history were reviewed and updated as appropriate: allergies, current medications, past family history, past medical history, past social history, past surgical history and problem list. Problem list updated.  Objective:   Vitals:   03/01/21 1340  BP: 100/65  Pulse: 94  Weight: 163 lb (73.9 kg)    Fetal Status: Fetal Heart Rate (bpm): 152   Movement: Present     General:  Alert, oriented and cooperative. Patient is in no acute distress.  Skin: Skin is warm and dry. No rash noted.   Cardiovascular: Normal heart rate noted  Respiratory: Normal respiratory effort, no problems with respiration noted  Abdomen: Soft, gravid, appropriate for gestational age. Pain/Pressure: Present     Pelvic:  Cervical exam performed        Extremities: Normal range of motion.  Edema: None  Mental Status: Normal mood and affect. Normal behavior. Normal judgment and thought content.   Urinalysis:      Assessment and Plan:  Pregnancy: G2P1001 at [redacted]w[redacted]d  1. Supervision of other normal pregnancy, antepartum Stable  2. History of cesarean delivery Desires VBAC, Consented 03/01/2021  3. Abnormal fetal ultrasound Fetal PAC's. F/U U/S next week  Preterm labor symptoms and general obstetric precautions including but not limited to vaginal bleeding,  contractions, leaking of fluid and fetal movement were reviewed in detail with the patient. Please refer to After Visit Summary for other counseling recommendations.  Return in about 2 weeks (around 03/15/2021) for OB visit, face to face, MD only.   Hermina Staggers, MD

## 2021-03-01 NOTE — Patient Instructions (Signed)

## 2021-03-08 ENCOUNTER — Other Ambulatory Visit: Payer: Self-pay

## 2021-03-08 ENCOUNTER — Ambulatory Visit: Payer: Medicaid Other | Admitting: *Deleted

## 2021-03-08 ENCOUNTER — Ambulatory Visit: Payer: Medicaid Other | Attending: Maternal & Fetal Medicine

## 2021-03-08 ENCOUNTER — Encounter: Payer: Self-pay | Admitting: *Deleted

## 2021-03-08 DIAGNOSIS — O0933 Supervision of pregnancy with insufficient antenatal care, third trimester: Secondary | ICD-10-CM | POA: Diagnosis not present

## 2021-03-08 DIAGNOSIS — Z98891 History of uterine scar from previous surgery: Secondary | ICD-10-CM | POA: Insufficient documentation

## 2021-03-09 ENCOUNTER — Other Ambulatory Visit: Payer: Self-pay | Admitting: *Deleted

## 2021-03-09 DIAGNOSIS — O09523 Supervision of elderly multigravida, third trimester: Secondary | ICD-10-CM

## 2021-03-15 ENCOUNTER — Ambulatory Visit (INDEPENDENT_AMBULATORY_CARE_PROVIDER_SITE_OTHER): Payer: Medicaid Other | Admitting: Obstetrics & Gynecology

## 2021-03-15 ENCOUNTER — Other Ambulatory Visit: Payer: Self-pay

## 2021-03-15 ENCOUNTER — Encounter: Payer: Self-pay | Admitting: Obstetrics & Gynecology

## 2021-03-15 VITALS — BP 107/74 | HR 99 | Wt 165.0 lb

## 2021-03-15 DIAGNOSIS — Z98891 History of uterine scar from previous surgery: Secondary | ICD-10-CM

## 2021-03-15 DIAGNOSIS — O09529 Supervision of elderly multigravida, unspecified trimester: Secondary | ICD-10-CM

## 2021-03-15 DIAGNOSIS — O321XX0 Maternal care for breech presentation, not applicable or unspecified: Secondary | ICD-10-CM

## 2021-03-15 DIAGNOSIS — Z3A34 34 weeks gestation of pregnancy: Secondary | ICD-10-CM

## 2021-03-15 DIAGNOSIS — O283 Abnormal ultrasonic finding on antenatal screening of mother: Secondary | ICD-10-CM

## 2021-03-15 NOTE — Progress Notes (Signed)
PRENATAL VISIT NOTE  Subjective:  Christine Ibarra is a 45 y.o. G2P1001 at [redacted]w[redacted]d being seen today for ongoing prenatal care.  She is currently monitored for the following issues for this high-risk pregnancy and has Late prenatal care affecting pregnancy in second trimester; Encounter for supervision of high-risk pregnancy with multigravida of advanced maternal age; History of cesarean delivery x1 ; Back pain in pregnancy; Excessive weight gain during pregnancy in second trimester; and Abnormal fetal ultrasound on their problem list.  Patient reports no complaints.  Contractions: Irritability. Vag. Bleeding: None.  Movement: Present. Denies leaking of fluid.   The following portions of the patient's history were reviewed and updated as appropriate: allergies, current medications, past family history, past medical history, past social history, past surgical history and problem list.   Objective:   Vitals:   03/15/21 1357  BP: 107/74  Pulse: 99  Weight: 165 lb (74.8 kg)    Fetal Status: Fetal Heart Rate (bpm): 147   Movement: Present     General:  Alert, oriented and cooperative. Patient is in no acute distress.  Skin: Skin is warm and dry. No rash noted.   Cardiovascular: Normal heart rate noted  Respiratory: Normal respiratory effort, no problems with respiration noted  Abdomen: Soft, gravid, appropriate for gestational age.  Pain/Pressure: Present     Pelvic: Cervical exam deferred        Extremities: Normal range of motion.  Edema: Trace  Mental Status: Normal mood and affect. Normal behavior. Normal judgment and thought content.   Imaging: [place] MFM OB FOLLOW UP  Result Date: 03/08/2021 ----------------------------------------------------------------------  OBSTETRICS REPORT                       (Signed Final 03/08/2021 04:04 pm) ---------------------------------------------------------------------- Patient Info  ID #:       [id number]                          D.O.B.:  12-Dec-1975  (44 yrs)  Name:       Christine Ibarra              Visit Date: 03/08/2021 02:42 pm ---------------------------------------------------------------------- Performed By  Attending:        Lin Landsman      Ref. Address[facility]  Performed By:     Reinaldo Raddle            [facility]  Referred By:      Warden Fillers MD ---------------------------------------------------------------------- Orders  #  Description                           Code        Ordered By  1  [place] MFM OB FOLLOW UP                   44818.56    Lin Landsman ----------------------------------------------------------------------  #  Order #                     Accession #                Episode #  1  [id number]                   [id number]                 [id number] ---------------------------------------------------------------------- Indications  Advanced maternal age multigravida 93+,        O36.523  third trimester  History of cesarean delivery, currently        O34.219  pregnant  Encounter for other antenatal screening        Z36.2  follow-up  Late to prenatal care, third trimester         O09.33  [redacted] weeks gestation of pregnancy                Z3A.33 ---------------------------------------------------------------------- Fetal Evaluation  Num Of Fetuses:         1  Fetal Heart Rate(bpm):  152  Cardiac Activity:       Arrhythmia noted  Presentation:           Breech  Placenta:               Posterior  P. Cord Insertion:      Previously Visualized  Amniotic Fluid  AFI  FV:      Within normal limits  AFI Sum(cm)     %Tile       Largest Pocket(cm)  11.57           30          6.36  RUQ(cm)       RLQ(cm)  LUQ(cm)        LLQ(cm)  3.63          6.36          0.65           0.93 ---------------------------------------------------------------------- Biometry  BPD:        89  mm     G. Age:  36w 0d         94  %    CI:        73.48   %    70 - 86                                                          FL/HC:      19.1   %    19.4 - 21.8  HC:      329.9  mm     G. Age:  37w 4d         94  %    HC/AC:      1.02        0.96 - 1.11  AC:      324.9  mm     G. Age:  36w 3d         98  %    FL/BPD:     70.9   %    71 - 87  FL:       63.1  mm     G. Age:  32w 4d         13  %    FL/AC:      19.4   %    20 - 24  LV:        4.6  mm  Est. FW:    2700  gm    5 lb 15 oz      88  % ---------------------------------------------------------------------- OB History  Blood Type:   O+  Gravidity:    2         Term:   1        Prem:   0        SAB:   0  TOP:          0       Ectopic:  0        Living: 1 ---------------------------------------------------------------------- Gestational Age  LMP:           34w 6d        Date:  07/07/20                 EDD:   04/13/21  U/S Today:     35w 5d                                        EDD:   04/07/21  Best:          33w 6d     Det. By:  U/S  (12/14/20)          EDD:   04/20/21 ---------------------------------------------------------------------- Anatomy  Cranium:               Previously seen        LVOT:  Previously seen  Cavum:                 Previously seen        Aortic Arch:            Appears normal  Ventricles:            Appears normal         Ductal Arch:            Previously seen  Choroid Plexus:        Previously seen        Diaphragm:              Appears normal  Cerebellum:            Previously seen        Stomach:                Appears normal, left                                                                        sided   Posterior Fossa:       Previously seen        Abdomen:                Appears normal  Nuchal Fold:           Not applicable (>20    Abdominal Wall:         Previously seen                         wks GA)  Face:                  Orbits and profile     Cord Vessels:           Previously seen                         previously seen  Lips:                  Previously seen        Kidneys:                Appear normal  Palate:                Not well visualized    Bladder:                Appears normal  Thoracic:              Previously seen        Spine:                  Previously seen  Heart:                 Previously seen        Upper Extremities:      Previously seen  RVOT:                  Previously seen        Lower Extremities:      Previously seen  Other:  Fetus appears to  be female. Nasal bone prev visualized. Heels/feet          and open hands/5th digits prev visualized. ---------------------------------------------------------------------- Cervix Uterus Adnexa  Cervix  Not visualized (advanced GA >24wks) ---------------------------------------------------------------------- Impression  Ms. Chestine SporeClark is here for follow up growth given AMA and fetal  PAC's.  There is normal interval growth observed today, with good  fetal movement.  Today we observed fetal PAC's irregularly every with a range  from 1:20 - 1:30 beats, with no evidence of fetal tachycardia  or hydrops.  I conveyed today's findings to Ms. Clarck all questions  answered. ---------------------------------------------------------------------- Recommendations  Continue weekly fetal auscultation during her prenatal visits.  Follow up growth in 4 weeks given AMA >40  Initiate weekly testing at 36 weeks. ----------------------------------------------------------------------               Lin Landsmanorenthian Booker, MD Electronically Signed Final Report   03/08/2021 04:04 pm ----------------------------------------------------------------------   Assessment and  Plan:  Pregnancy: G2P1001 at 4925w6d 1. History of cesarean delivery Desires TOLAC, consent signed 03/01/2021.  2. Abnormal fetal ultrasound with PACs Followed by MFM, see recommendations above.  3. Breech presentation, single or unspecified fetus Will follow up presentation on subsequent scans. If still breech next couple of scans, patient to decide between version or RCS. Information given to her. Also discussed spinning babies, moxibustion.  4. [redacted] weeks gestation of pregnancy 5. Encounter for supervision of high-risk pregnancy with multigravida of advanced maternal age Weekly BPP testing to start next week.  - US MFM FETAL BPP WO NON STRESS; Future She [facility], I also emailed them with her information. Informed her that children cannot be [facility]. Preterm labor symptoms and general obstetric precautions including but not limited to vaginal bleeding, contractions, leaking of fluid and fetal movement were reviewed in detail with the patient. Please refer to After Visit Summary for other counseling recommendations.   Return in about 1 week (around 03/22/2021) for Pelvic cultures, OFFICE OB VISIT (MD only).  Future Appointments  Date Time Provider Department Center  03/22/2021  4:15 PM Hermina StaggersErvin, Michael L, MD [facility] None  04/05/2021  2:30 PM [facility]  04/05/2021  2:45 PM [facility] US5 [facility]    Jaynie CollinsUgonna Arlyn Bumpus, MD

## 2021-03-15 NOTE — Patient Instructions (Addendum)
DOULA LIST   Email [email or web address]  [employer] Doula  [place]  [phone]  [email or web address]  [email or web address]  Zula the [employer] [phone]  [email or web address]   [employer]   Precious Danford Bad   [email or web address]   ??THE MOTHERLY DOULA?? Zola Button   [phone]   [email or web address]     [employer] [employer] Doula  Olive Bass  [phone]    [email or web address]   [facility]  Angie Rosier     [phone]     [email or web address]   Renato Gails: Doula & Photographer   Renato Gails [phone]       [email or web address]  [email or web address]   [employer] [facility]oula [facility]  [place] [phone]   [email or web address] [place], [place]  Lolita Rieger  [phone]  [email or web address]   [email or web address]   [facility]   Kizzie Furnish   [phone]  [email or web address]   [facility] Doula  Dina Rich  [phone] [email or web address]  [facility] Doulas  Cornelious Bryant         [facility]       Lora Reynolds     [phone] [email or web address]  [email or web address]   [facility]   [place] [phone] [email or web address]   [facility]  Camelia Eng     [phone]  [email or web address]  [facility]     [phone]  [email or web address]  Facebook [email or web address] IG [email or web address] .  [phone] [phone] [email or web address]   [phone]  [phone] [email or web address]     [facility]  [phone] [email or web address]   IG [email or web address]    [phone]    [phone] [email or web address]   Lurline Hare "[place]" Free  [phone] [email or web address]    Mtende Roll  [phone] [email or web address]   Susie Williams   [email or web address]    [phone]    [phone] [email or web address]     Denita Lung  [phone] [email or web address]    Lenard Galloway  [phone] [email or web address]    Tarkio Rhem    [phone]   [facility] Red wing  [phone] [email or web address]  Doula Mama [phone] [email or web address]  [facility] Maryjean Ka  [phone] [email or web address]  [facility] [phone]  [email or web address]  [email or web address] Montgomery Harris-Jones  [phone] [email or web address] .[email or web address]   Allen Kell [phone] [email or web address] .Ardine Eng   [phone] [email or web address]   Gilda Crease  [phone] [email or web address]  [employer]  Jonelle Sports  [phone] [email or web address]   Jackelyn Poling [phone] [email or web address]  MOOR Divine Myeka Dunn  [email or web address] .[email or web address]   Cristina Gong [phone] [email or web address]   Glory Buff [phone] [email or web address]   Whitney Muse [phone] [email or web address]           Breech Birth A breech birth is when a baby is born with the buttocks or feet first. Most babies are in a head down (vertex) position when they are born. There are three types of breech babies:  When the baby's buttocks are showing first in the vagina (birth canal) with the legs bent at the knees and the feet down near the buttocks (complete breech).  When the baby's buttocks are showing first in the birth canal with the legs straight up and the feet at the baby's head (frank breech).  When one or both of the baby's feet are  showing first in the birth canal along  with the buttocks (footling breech). What increases the risk of having a breech baby? It is not known what causes your baby to be breech. However, you are more likely to have a breech baby if:  You have had a previous pregnancy.  You are having more than one baby.  Your baby has certain birth (congenital) defects.  You have started your labor earlier than expected (premature labor).  You have problems with your uterus, such as a growths or an abnormally-shaped uterus.  You have too much or not enough fluid surrounding the baby (amniotic fluid).  The placenta covers all or part of the opening of the uterus (placenta previa). How does this affect me? There are no symptoms for you to know that your baby is breech. When you are close to your due date, your health care provider can tell if your baby is breech by doing:  An abdominal or vaginal (pelvic) exam.  An ultrasound. You and your health care provider will discuss the best way to deliver your baby. If your baby is breech, it is less likely that a vaginal delivery will be recommended due to the risks to you and your baby. How does this affect my baby? Having a breech birth increases the health risks to your baby. A breech birth may cause the following:  Umbilical cord prolapse. This is when the umbilical cord enters the birth canal ahead of the baby, before or during labor. This can cause the cord to become pinched or compressed as labor continues. This can reduce the flow of blood and oxygen to the baby.  The baby getting stuck in the birth canal, which can cause injury or, rarely, death.  Injury to the baby's nerves in the shoulder, arm, and hand (brachial plexus injury) when delivered. How is this treated? Your health care provider may try to turn the baby in your uterus. He or she will use a procedure called external cephalic version (ECV). He or she will place both hands on your abdomen and  gently and slowly turn the baby around. It is important to know that ECV can increase your chances of suddenly going into labor. For this reason, an ECV is only done toward the end of a healthy pregnancy. The baby may remain in this position, but sometimes he or she may turn back to the breech position. You and your health care provider will discuss if an ECV is recommended for you and your baby. Your health care provider may recommend that you deliver your baby through a cesarean delivery (C-section). A C-section is the surgical delivery of a baby through an incision in the abdomen and the uterus.   Contact a health care provider if: Get help right away if:  Your baby is breech and you have regular painful contractions or loss of your mucus plug (bloody show).  Your water breaks.  You see the baby's umbilical cord protruding from your vagina. Summary  A breech birth is when a baby is born with the buttocks or feet first.  Having a breech birth may increase the risks to your baby.  Your health care provider may try to turn your baby in your uterus using a procedure called an external cephalic version (ECV).  If your baby cannot be turned to a head down position or if your baby remains in a breech position, your health care provider will make recommendations about the safest way to deliver your baby. This information is not intended to replace advice  given to you by your health care provider. Make sure you discuss any questions you have with your health care provider. Document Revised: 08/29/2020 Document Reviewed: 08/29/2020 Elsevier Patient Education  2021 [employer].

## 2021-03-15 NOTE — Progress Notes (Addendum)
ROB she is concern about the baby's breech position and wants to know if she can bring a [place], husband and daughter to Hospital?

## 2021-03-16 ENCOUNTER — Encounter: Payer: Self-pay | Admitting: *Deleted

## 2021-03-16 ENCOUNTER — Telehealth: Payer: Self-pay | Admitting: *Deleted

## 2021-03-16 NOTE — Telephone Encounter (Signed)
Pt called to office today with questions about her appt yesterday.  Attempt to return call.  No answer, LVM.

## 2021-03-22 ENCOUNTER — Other Ambulatory Visit: Payer: Self-pay

## 2021-03-22 ENCOUNTER — Other Ambulatory Visit: Payer: Medicaid Other

## 2021-03-22 ENCOUNTER — Ambulatory Visit (INDEPENDENT_AMBULATORY_CARE_PROVIDER_SITE_OTHER): Payer: Medicaid Other

## 2021-03-22 ENCOUNTER — Encounter: Payer: Self-pay | Admitting: Obstetrics and Gynecology

## 2021-03-22 ENCOUNTER — Ambulatory Visit (INDEPENDENT_AMBULATORY_CARE_PROVIDER_SITE_OTHER): Payer: Medicaid Other | Admitting: Obstetrics and Gynecology

## 2021-03-22 VITALS — BP 110/72 | HR 96 | Wt 163.0 lb

## 2021-03-22 DIAGNOSIS — Z98891 History of uterine scar from previous surgery: Secondary | ICD-10-CM

## 2021-03-22 DIAGNOSIS — O321XX0 Maternal care for breech presentation, not applicable or unspecified: Secondary | ICD-10-CM | POA: Diagnosis not present

## 2021-03-22 DIAGNOSIS — Z3A36 36 weeks gestation of pregnancy: Secondary | ICD-10-CM | POA: Diagnosis not present

## 2021-03-22 DIAGNOSIS — O283 Abnormal ultrasonic finding on antenatal screening of mother: Secondary | ICD-10-CM

## 2021-03-22 DIAGNOSIS — O09529 Supervision of elderly multigravida, unspecified trimester: Secondary | ICD-10-CM

## 2021-03-22 DIAGNOSIS — O09523 Supervision of elderly multigravida, third trimester: Secondary | ICD-10-CM | POA: Diagnosis not present

## 2021-03-22 NOTE — Progress Notes (Signed)
+   Fetal movement. No complaints.  

## 2021-03-22 NOTE — Progress Notes (Signed)
Subjective:  Christine Ibarra is a 45 y.o. G2P1001 at [redacted]w[redacted]d being seen today for ongoing prenatal care.  She is currently monitored for the following issues for this high-risk pregnancy and has Late prenatal care affecting pregnancy in second trimester; Encounter for supervision of high-risk pregnancy with multigravida of advanced maternal age; History of cesarean delivery x1 ; Back pain in pregnancy; Excessive weight gain during pregnancy in second trimester; Abnormal fetal ultrasound; and AMA (advanced maternal age) multigravida 35+ on their problem list.  Patient reports general discomforts of pregnancy.  Contractions: Irritability. Vag. Bleeding: None.  Movement: Present. Denies leaking of fluid.   The following portions of the patient's history were reviewed and updated as appropriate: allergies, current medications, past family history, past medical history, past social history, past surgical history and problem list. Problem list updated.  Objective:   Vitals:   03/22/21 1319  BP: 110/72  Pulse: 96  Weight: 163 lb (73.9 kg)    Fetal Status: Fetal Heart Rate (bpm): 143   Movement: Present     General:  Alert, oriented and cooperative. Patient is in no acute distress.  Skin: Skin is warm and dry. No rash noted.   Cardiovascular: Normal heart rate noted  Respiratory: Normal respiratory effort, no problems with respiration noted  Abdomen: Soft, gravid, appropriate for gestational age. Pain/Pressure: Present     Pelvic:  Cervical exam deferred        Extremities: Normal range of motion.  Edema: Trace  Mental Status: Normal mood and affect. Normal behavior. Normal judgment and thought content.   Urinalysis:      Assessment and Plan:  Pregnancy: G2P1001 at [redacted]w[redacted]d  1. Encounter for supervision of high-risk pregnancy with multigravida of advanced maternal age Stable GBS next visit - US OB Limited; Future  2. History of cesarean delivery x1  Desires TOLAC, consent signed Will  recheck fetal position at next visit. ECV vs RLTCS discussed, pt desires to wait until next weeks visit to decide  3. Multigravida of advanced maternal age in third trimester NST 130's, + Acceks, no decels, rare ut ctx AFI normal today Repeat next week  4. Abnormal fetal ultrasound No PAC's noted today  Preterm labor symptoms and general obstetric precautions including but not limited to vaginal bleeding, contractions, leaking of fluid and fetal movement were reviewed in detail with the patient. Please refer to After Visit Summary for other counseling recommendations.  Return in about 1 week (around 03/29/2021) for OB visit, face to face, MD only, NST and AFI.   Hermina Staggers, MD

## 2021-03-22 NOTE — Patient Instructions (Signed)
Breech Birth A breech birth is when a baby is born with the buttocks or feet first. Most babies are in a head down (vertex) position when they are born. There are three types of breech babies:  When the baby's buttocks are showing first in the vagina (birth canal) with the legs bent at the knees and the feet down near the buttocks (complete breech).  When the baby's buttocks are showing first in the birth canal with the legs straight up and the feet at the baby's head (frank breech).  When one or both of the baby's feet are showing first in the birth canal along with the buttocks (footling breech). What increases the risk of having a breech baby? It is not known what causes your baby to be breech. However, you are more likely to have a breech baby if:  You have had a previous pregnancy.  You are having more than one baby.  Your baby has certain birth (congenital) defects.  You have started your labor earlier than expected (premature labor).  You have problems with your uterus, such as a growths or an abnormally-shaped uterus.  You have too much or not enough fluid surrounding the baby (amniotic fluid).  The placenta covers all or part of the opening of the uterus (placenta previa). How does this affect me? There are no symptoms for you to know that your baby is breech. When you are close to your due date, your health care provider can tell if your baby is breech by doing:  An abdominal or vaginal (pelvic) exam.  An ultrasound. You and your health care provider will discuss the best way to deliver your baby. If your baby is breech, it is less likely that a vaginal delivery will be recommended due to the risks to you and your baby. How does this affect my baby? Having a breech birth increases the health risks to your baby. A breech birth may cause the following:  Umbilical cord prolapse. This is when the umbilical cord enters the birth canal ahead of the baby, before or during labor.  This can cause the cord to become pinched or compressed as labor continues. This can reduce the flow of blood and oxygen to the baby.  The baby getting stuck in the birth canal, which can cause injury or, rarely, death.  Injury to the baby's nerves in the shoulder, arm, and hand (brachial plexus injury) when delivered. How is this treated? Your health care provider may try to turn the baby in your uterus. He or she will use a procedure called external cephalic version (ECV). He or she will place both hands on your abdomen and gently and slowly turn the baby around. It is important to know that ECV can increase your chances of suddenly going into labor. For this reason, an ECV is only done toward the end of a healthy pregnancy. The baby may remain in this position, but sometimes he or she may turn back to the breech position. You and your health care provider will discuss if an ECV is recommended for you and your baby. Your health care provider may recommend that you deliver your baby through a cesarean delivery (C-section). A C-section is the surgical delivery of a baby through an incision in the abdomen and the uterus.   Contact a health care provider if: Get help right away if:  Your baby is breech and you have regular painful contractions or loss of your mucus plug (bloody show).  Your   water breaks.  You see the baby's umbilical cord protruding from your vagina. Summary  A breech birth is when a baby is born with the buttocks or feet first.  Having a breech birth may increase the risks to your baby.  Your health care provider may try to turn your baby in your uterus using a procedure called an external cephalic version (ECV).  If your baby cannot be turned to a head down position or if your baby remains in a breech position, your health care provider will make recommendations about the safest way to deliver your baby. This information is not intended to replace advice given to you by  your health care provider. Make sure you discuss any questions you have with your health care provider. Document Revised: 08/29/2020 Document Reviewed: 08/29/2020 Elsevier Patient Education  2021 Elsevier Inc.  

## 2021-03-30 ENCOUNTER — Encounter: Payer: Self-pay | Admitting: Obstetrics & Gynecology

## 2021-03-30 ENCOUNTER — Other Ambulatory Visit: Payer: Self-pay

## 2021-03-30 ENCOUNTER — Ambulatory Visit (INDEPENDENT_AMBULATORY_CARE_PROVIDER_SITE_OTHER): Payer: Medicaid Other | Admitting: Obstetrics & Gynecology

## 2021-03-30 ENCOUNTER — Ambulatory Visit (INDEPENDENT_AMBULATORY_CARE_PROVIDER_SITE_OTHER): Payer: Medicaid Other

## 2021-03-30 VITALS — BP 113/76 | HR 93 | Wt 172.3 lb

## 2021-03-30 DIAGNOSIS — O09523 Supervision of elderly multigravida, third trimester: Secondary | ICD-10-CM

## 2021-03-30 DIAGNOSIS — O321XX0 Maternal care for breech presentation, not applicable or unspecified: Secondary | ICD-10-CM

## 2021-03-30 DIAGNOSIS — O09529 Supervision of elderly multigravida, unspecified trimester: Secondary | ICD-10-CM

## 2021-03-30 DIAGNOSIS — Z3A37 37 weeks gestation of pregnancy: Secondary | ICD-10-CM | POA: Diagnosis not present

## 2021-03-30 NOTE — Patient Instructions (Signed)
Cesarean Delivery Cesarean birth, or cesarean delivery, is the surgical delivery of a baby through an incision in the abdomen and the uterus. This may be referred to as a C-section. This procedure may be scheduled ahead of time, or it may be done in an emergency situation. Tell a health care provider about:  Any allergies you have.  All medicines you are taking, including vitamins, herbs, eye drops, creams, and over-the-counter medicines.  Any problems you or family members have had with anesthetic medicines.  Any blood disorders you have.  Any surgeries you have had.  Any medical conditions you have.  Whether you or any members of your family have a history of deep vein thrombosis (DVT) or pulmonary embolism (PE). What are the risks? Generally, this is a safe procedure. However, problems may occur, including:  Infection.  Bleeding.  Allergic reactions to medicines.  Damage to other structures or organs.  Blood clots.  Injury to your baby. What happens before the procedure? General instructions  Follow instructions from your health care provider about eating or drinking restrictions.  If you know that you are going to have a cesarean delivery, do not shave your pubic area. Shaving before the procedure may increase your risk of infection.  Plan to have someone take you home from the hospital.  Ask your health care provider what steps will be taken to prevent infection. These may include: ? Removing hair at the surgery site. ? Washing skin with a germ-killing soap. ? Taking antibiotic medicine.  Depending on the reason for your cesarean delivery, you may have a physical exam or additional testing, such as an ultrasound.  You may have your blood or urine tested. Questions for your health care provider  Ask your health care provider about: ? Changing or stopping your regular medicines. This is especially important if you are taking diabetes medicines or blood  thinners. ? Your pain management plan. This is especially important if you plan to breastfeed your baby. ? How long you will be in the hospital after the procedure. ? Any concerns you may have about receiving blood products, if you need them during the procedure. ? Cord blood banking, if you plan to collect your baby's umbilical cord blood.  You may also want to ask your health care provider: ? Whether you will be able to hold or breastfeed your baby while you are still in the operating room. ? Whether your baby can stay with you immediately after the procedure and during your recovery. ? Whether a family member or a person of your choice can go with you into the operating room and stay with you during the procedure, immediately after the procedure, and during your recovery. What happens during the procedure?  An IV will be inserted into one of your veins.  Fluid and medicines, such as antibiotics, will be given before the surgery.  Fetal monitors will be placed on your abdomen to check your baby's heart rate.  You may be given a special warming gown to wear to keep your temperature stable.  A catheter may be inserted into your bladder through your urethra. This drains your urine during the procedure.  You may be given one or more of the following: ? A medicine to numb the area (local anesthetic). ? A medicine to make you fall asleep (general anesthetic). ? A medicine (regional anesthetic) that is injected into your back or through a small thin tube placed in your back (spinal anesthetic or epidural anesthetic). This   numbs everything below the injection site and allows you to stay awake during your procedure. If this makes you feel nauseous, tell your health care provider. Medicines will be available to help reduce any nausea you may feel.  An incision will be made in your abdomen, and then in your uterus.  If you are awake during your procedure, you may feel tugging and pulling in your  abdomen, but you should not feel pain. If you feel pain, tell your health care provider immediately.  Your baby will be removed from your uterus. You may feel more pressure or pushing while this happens.  Immediately after birth, your baby will be dried and kept warm. You may be able to hold and breastfeed your baby.  The umbilical cord may be clamped and cut during this time. This usually occurs after waiting a period of 1-2 minutes after delivery.  Your placenta will be removed from your uterus.  Your incisions will be closed with stitches (sutures). Staples, skin glue, or adhesive strips may also be applied to the incision in your abdomen.  Bandages (dressings) may be placed over the incision in your abdomen. The procedure may vary among health care providers and hospitals.   What happens after the procedure?  Your blood pressure, heart rate, breathing rate, and blood oxygen level will be monitored until you are discharged from the hospital.  You may continue to receive fluids and medicines through an IV.  You will have some pain. Medicines will be available to help control your pain.  To help prevent blood clots: ? You may be given medicines. ? You may have to wear compression stockings or devices. ? You will be encouraged to walk around when you are able.  Hospital staff will encourage and support bonding with your baby. Your hospital may have you and your baby to stay in the same room (rooming in) during your hospital stay to encourage successful bonding and breastfeeding.  You may be encouraged to cough and breathe deeply often. This helps to prevent lung problems.  If you have a catheter draining your urine, it will be removed as soon as possible after your procedure. Summary  Cesarean birth, or cesarean delivery, is the surgical delivery of a baby through an incision in the abdomen and the uterus.  Follow instructions from your health care provider about eating or  drinking restrictions before the procedure.  You will have some pain after the procedure. Medicines will be available to help control your pain.  Hospital staff will encourage and support bonding with your baby after the procedure. Your hospital may have you and your baby to stay in the same room (rooming in) during your hospital stay to encourage successful bonding and breastfeeding. This information is not intended to replace advice given to you by your health care provider. Make sure you discuss any questions you have with your health care provider. Document Revised: 07/11/2020 Document Reviewed: 05/19/2018 Elsevier Patient Education  2021 Elsevier Inc.   

## 2021-03-30 NOTE — Progress Notes (Signed)
   PRENATAL VISIT NOTE  Subjective:  Christine Ibarra is a 45 y.o. G2P1001 at [redacted]w[redacted]d being seen today for ongoing prenatal care.  She is currently monitored for the following issues for this high-risk pregnancy and has Late prenatal care affecting pregnancy in second trimester; Encounter for supervision of high-risk pregnancy with multigravida of advanced maternal age; History of cesarean delivery x1 ; Back pain in pregnancy; Excessive weight gain during pregnancy in second trimester; Abnormal fetal ultrasound; and AMA (advanced maternal age) multigravida 35+ on their problem list.  Patient reports occasional contractions.  Contractions: Irritability. Vag. Bleeding: None.  Movement: Present. Denies leaking of fluid.   The following portions of the patient's history were reviewed and updated as appropriate: allergies, current medications, past family history, past medical history, past social history, past surgical history and problem list.   Objective:   Vitals:   03/30/21 1411  BP: 113/76  Pulse: 93  Weight: 172 lb 4.8 oz (78.2 kg)    Fetal Status:     Movement: Present     General:  Alert, oriented and cooperative. Patient is in no acute distress.  Skin: Skin is warm and dry. No rash noted.   Cardiovascular: Normal heart rate noted  Respiratory: Normal respiratory effort, no problems with respiration noted  Abdomen: Soft, gravid, appropriate for gestational age.  Pain/Pressure: Present     Pelvic: Cervical exam deferred        Extremities: Normal range of motion.  Edema: None  Mental Status: Normal mood and affect. Normal behavior. Normal judgment and thought content.   Assessment and Plan:  Pregnancy: G2P1001 at [redacted]w[redacted]d 1. Encounter for supervision of high-risk pregnancy with multigravida of advanced maternal age Reactive NST nl AFI, breech - Fetal nonstress test - US OB Limited; Future  2. Multigravida of advanced maternal age in third trimester  - Fetal nonstress test - US  OB Limited; Future Breech, declines IOL, schedule CS 39.2 patient requests date Term labor symptoms and general obstetric precautions including but not limited to vaginal bleeding, contractions, leaking of fluid and fetal movement were reviewed in detail with the patient. Please refer to After Visit Summary for other counseling recommendations.   Return in about 1 week (around 04/06/2021).  Future Appointments  Date Time Provider Department Center  04/05/2021  2:30 PM [facility] NURSE [facility]  04/05/2021  2:45 PM [facility] US5 [facility]    Scheryl Darter, MD

## 2021-03-30 NOTE — Progress Notes (Signed)
Patient presents for ROB, NST, and AFI.   Baby was in complete breech position last week.   AFI 15.1

## 2021-03-31 ENCOUNTER — Telehealth: Payer: Self-pay | Admitting: *Deleted

## 2021-03-31 ENCOUNTER — Encounter: Payer: Self-pay | Admitting: *Deleted

## 2021-03-31 NOTE — Telephone Encounter (Signed)
Call to patient. Voice mail has number confirmation. Left message stating scheduled as requested and to check My Chart. Left call back number [phone].

## 2021-04-03 ENCOUNTER — Encounter (HOSPITAL_COMMUNITY): Payer: Self-pay

## 2021-04-03 NOTE — Patient Instructions (Addendum)
Naia Carboni  04/03/2021   Your procedure is scheduled on:  04/22/2021  Arrive at 0730 at Entrance C on [address] at [facility]  and [employer]. You are invited to use the FREE valet parking or use the Visitor's parking deck.  Pick up the phone at the desk and dial [phone].  Call this number if you have problems the morning of surgery: [phone]  Remember:   Do not eat food:(After Midnight) Desps de medianoche.  Do not drink clear liquids: (After Midnight) Desps de medianoche.  Take these medicines the morning of surgery with A SIP OF WATER:  none   Do not wear jewelry, make-up or nail polish.  Do not wear lotions, powders, or perfumes. Do not wear deodorant.  Do not shave 48 hours prior to surgery.  Do not bring valuables to the hospital.  [facility] is not   responsible for any belongings or valuables brought to the hospital.  Contacts, dentures or bridgework may not be worn into surgery.  Leave suitcase in the car. After surgery it may be brought to your room.  For patients admitted to the hospital, checkout time is 11:00 AM the day of              discharge.      Please read over the following fact sheets that you were given:     Preparing for Surgery

## 2021-04-04 NOTE — Telephone Encounter (Signed)
Encounter closed

## 2021-04-05 ENCOUNTER — Encounter: Payer: Self-pay | Admitting: *Deleted

## 2021-04-05 ENCOUNTER — Ambulatory Visit: Payer: Medicaid Other | Attending: Maternal & Fetal Medicine

## 2021-04-05 ENCOUNTER — Other Ambulatory Visit: Payer: Self-pay

## 2021-04-05 ENCOUNTER — Ambulatory Visit: Payer: Medicaid Other | Admitting: *Deleted

## 2021-04-05 DIAGNOSIS — O09529 Supervision of elderly multigravida, unspecified trimester: Secondary | ICD-10-CM | POA: Diagnosis present

## 2021-04-05 DIAGNOSIS — Z98891 History of uterine scar from previous surgery: Secondary | ICD-10-CM | POA: Diagnosis present

## 2021-04-05 DIAGNOSIS — O09523 Supervision of elderly multigravida, third trimester: Secondary | ICD-10-CM

## 2021-04-07 ENCOUNTER — Other Ambulatory Visit: Payer: Self-pay

## 2021-04-07 ENCOUNTER — Ambulatory Visit (INDEPENDENT_AMBULATORY_CARE_PROVIDER_SITE_OTHER): Payer: Medicaid Other | Admitting: Family Medicine

## 2021-04-07 ENCOUNTER — Other Ambulatory Visit (HOSPITAL_COMMUNITY)
Admission: RE | Admit: 2021-04-07 | Discharge: 2021-04-07 | Disposition: A | Discharge status: Home / Self Care | Payer: Medicaid Other | Source: Ambulatory Visit | Attending: Obstetrics and Gynecology | Admitting: Obstetrics and Gynecology

## 2021-04-07 VITALS — BP 125/79 | HR 94 | Wt 170.0 lb

## 2021-04-07 DIAGNOSIS — O09529 Supervision of elderly multigravida, unspecified trimester: Secondary | ICD-10-CM | POA: Insufficient documentation

## 2021-04-07 DIAGNOSIS — O321XX0 Maternal care for breech presentation, not applicable or unspecified: Secondary | ICD-10-CM

## 2021-04-07 DIAGNOSIS — O09523 Supervision of elderly multigravida, third trimester: Secondary | ICD-10-CM

## 2021-04-07 DIAGNOSIS — O0932 Supervision of pregnancy with insufficient antenatal care, second trimester: Secondary | ICD-10-CM

## 2021-04-07 NOTE — Progress Notes (Signed)
   PRENATAL VISIT NOTE  Subjective:  Christine Ibarra is a 45 y.o. G2P1001 at [redacted]w[redacted]d being seen today for ongoing prenatal care.  She is currently monitored for the following issues for this high-risk pregnancy and has Late prenatal care affecting pregnancy in second trimester; Encounter for supervision of high-risk pregnancy with multigravida of advanced maternal age; History of cesarean delivery x1 ; Back pain in pregnancy; Excessive weight gain during pregnancy in second trimester; Abnormal fetal ultrasound; and AMA (advanced maternal age) multigravida 35+ on their problem list.  Patient reports no complaints.  Contractions: Irritability. Vag. Bleeding: None.  Movement: Present. Denies leaking of fluid.   The following portions of the patient's history were reviewed and updated as appropriate: allergies, current medications, past family history, past medical history, past social history, past surgical history and problem list.   Objective:   Vitals:   04/07/21 1118  BP: 125/79  Pulse: 94  Weight: 170 lb (77.1 kg)    Fetal Status: Fetal Heart Rate (bpm): 142   Movement: Present     General:  Alert, oriented and cooperative. Patient is in no acute distress.  Skin: Skin is warm and dry. No rash noted.   Cardiovascular: Normal heart rate noted  Respiratory: Normal respiratory effort, no problems with respiration noted  Abdomen: Soft, gravid, appropriate for gestational age.  Pain/Pressure: Present     Pelvic: Cervical exam deferred        Extremities: Normal range of motion.  Edema: Trace  Mental Status: Normal mood and affect. Normal behavior. Normal judgment and thought content.   Assessment and Plan:  Pregnancy: G2P1001 at [redacted]w[redacted]d 1. Encounter for supervision of high-risk pregnancy with multigravida of advanced maternal age Up to date Seen today with doula present during our discusions - Strep Gp B NAA - Cervicovaginal ancillary only( [place])  2. Multigravida of advanced  maternal age in third trimester Recommend delivery at 40wk if cephalic  3. Late prenatal care affecting pregnancy in second trimester  4. Breech presentation, single or unspecified fetus Length discussion today - Recommended spinning babies, chiropractic care, moxibustion.  - Desires to have presentation check at CS - Desires attempt at ECV on CS date-- reviewed risks of ECV (fetal intolerance, placental abruption, not successful). She would like attempt without regional anesthesia and may want second attempt with regional anesthesia in place.  - Strongly desires VBAC if able to be converted -Reviewed if cephalic or converts, recommendation for IOL at EDD (or patient could choose 39 wk IOL as well). Reviewed risk of stillbirth with waiting until after 40wks given AM. If she decided to go past 40 wks she would need NST with AFI or BPP weekly.    Term labor symptoms and general obstetric precautions including but not limited to vaginal bleeding, contractions, leaking of fluid and fetal movement were reviewed in detail with the patient. Please refer to After Visit Summary for other counseling recommendations.   Return in about 1 week (around 04/14/2021) for Routine prenatal care, MD only.  Future Appointments  Date Time Provider Department Center  04/13/2021  9:00 AM [facility] PAT 1 [facility] None  04/13/2021  9:55 AM [facility] None  04/14/2021 11:15 AM Warden Fillers, MD [facility] None    Federico Flake, MD

## 2021-04-09 LAB — STREP GP B NAA: Strep Gp B NAA: NEGATIVE

## 2021-04-11 LAB — CERVICOVAGINAL ANCILLARY ONLY
Chlamydia: NEGATIVE
Comment: NEGATIVE
Comment: NORMAL
Neisseria Gonorrhea: NEGATIVE

## 2021-04-13 ENCOUNTER — Other Ambulatory Visit: Payer: Self-pay

## 2021-04-13 ENCOUNTER — Encounter: Payer: Self-pay | Admitting: Obstetrics and Gynecology

## 2021-04-13 ENCOUNTER — Encounter (HOSPITAL_COMMUNITY)
Admission: RE | Admit: 2021-04-13 | Discharge: 2021-04-13 | Disposition: A | Discharge status: Home / Self Care | Payer: Medicaid Other | Source: Ambulatory Visit | Attending: Obstetrics and Gynecology | Admitting: Obstetrics and Gynecology

## 2021-04-13 ENCOUNTER — Other Ambulatory Visit (HOSPITAL_COMMUNITY): Payer: Medicaid Other

## 2021-04-13 ENCOUNTER — Encounter (HOSPITAL_COMMUNITY): Payer: Self-pay

## 2021-04-13 ENCOUNTER — Ambulatory Visit (INDEPENDENT_AMBULATORY_CARE_PROVIDER_SITE_OTHER): Payer: Medicaid Other | Admitting: Obstetrics and Gynecology

## 2021-04-13 ENCOUNTER — Telehealth: Payer: Self-pay

## 2021-04-13 ENCOUNTER — Ambulatory Visit (INDEPENDENT_AMBULATORY_CARE_PROVIDER_SITE_OTHER): Payer: Medicaid Other

## 2021-04-13 VITALS — BP 110/75 | HR 90 | Wt 172.0 lb

## 2021-04-13 DIAGNOSIS — O09523 Supervision of elderly multigravida, third trimester: Secondary | ICD-10-CM | POA: Diagnosis not present

## 2021-04-13 DIAGNOSIS — Z98891 History of uterine scar from previous surgery: Secondary | ICD-10-CM

## 2021-04-13 DIAGNOSIS — Z3A39 39 weeks gestation of pregnancy: Secondary | ICD-10-CM

## 2021-04-13 DIAGNOSIS — O09529 Supervision of elderly multigravida, unspecified trimester: Secondary | ICD-10-CM

## 2021-04-13 HISTORY — DX: Umbilical hernia without obstruction or gangrene: K42.9

## 2021-04-13 NOTE — Telephone Encounter (Signed)
Pt called to request moving c/s date from 04/15/21 to next Saturday. The provider approved and pt will keep ROB visit today.

## 2021-04-13 NOTE — Progress Notes (Signed)
Subjective:  Christine Ibarra is a 45 y.o. G2P1001 at [redacted]w[redacted]d being seen today for ongoing prenatal care.  She is currently monitored for the following issues for this high-risk pregnancy and has Late prenatal care affecting pregnancy in second trimester; Encounter for supervision of high-risk pregnancy with multigravida of advanced maternal age; History of cesarean delivery x1 ; Back pain in pregnancy; Excessive weight gain during pregnancy in second trimester; Abnormal fetal ultrasound; and AMA (advanced maternal age) multigravida 35+ on their problem list.  Patient reports general discomforts of pregnancy.  Contractions: Irregular. Vag. Bleeding: None.  Movement: Present. Denies leaking of fluid.   The following portions of the patient's history were reviewed and updated as appropriate: allergies, current medications, past family history, past medical history, past social history, past surgical history and problem list. Problem list updated.  Objective:   Vitals:   04/13/21 1021  BP: 110/75  Pulse: 90  Weight: 172 lb (78 kg)    Fetal Status: Fetal Heart Rate (bpm): 142   Movement: Present     General:  Alert, oriented and cooperative. Patient is in no acute distress.  Skin: Skin is warm and dry. No rash noted.   Cardiovascular: Normal heart rate noted  Respiratory: Normal respiratory effort, no problems with respiration noted  Abdomen: Soft, gravid, appropriate for gestational age. Pain/Pressure: Present     Pelvic:  Cervical exam deferred        Extremities: Normal range of motion.  Edema: Trace  Mental Status: Normal mood and affect. Normal behavior. Normal judgment and thought content.   Urinalysis:      Assessment and Plan:  Pregnancy: G2P1001 at [redacted]w[redacted]d  There are no diagnoses linked to this encounter. Term labor symptoms and general obstetric precautions including but not limited to vaginal bleeding, contractions, leaking of fluid and fetal movement were reviewed in detail  with the patient. Please refer to After Visit Summary for other counseling recommendations.  Return in about 1 week (around 04/20/2021) for OB visit, face to face, MD only.   Hermina Staggers, MD

## 2021-04-13 NOTE — Progress Notes (Signed)
NST/AFI performed.   FHR 140 6-25 BPM Reactive  AFI: 25.65

## 2021-04-13 NOTE — Patient Instructions (Signed)
Christine Ibarra  04/13/2021   Your procedure is scheduled on:  05/23/2021  Arrive at 0730 at Entrance C on [employer] at [facility]  and [employer]. You are invited to use the FREE valet parking or use the Visitor's parking deck.  Pick up the phone at the desk and dial [phone].  Call this number if you have problems the morning of surgery: [phone]  Remember:   Do not eat food:(After Midnight) Desps de medianoche.  Do not drink clear liquids: (After Midnight) Desps de medianoche.  Take these medicines the morning of surgery with A SIP OF WATER:  none   Do not wear jewelry, make-up or nail polish.  Do not wear lotions, powders, or perfumes. Do not wear deodorant.  Do not shave 48 hours prior to surgery.  Do not bring valuables to the hospital.  [facility] is not   responsible for any belongings or valuables brought to the hospital.  Contacts, dentures or bridgework may not be worn into surgery.  Leave suitcase in the car. After surgery it may be brought to your room.  For patients admitted to the hospital, checkout time is 11:00 AM the day of              discharge.      Please read over the following fact sheets that you were given:     Preparing for Surgery

## 2021-04-13 NOTE — Progress Notes (Signed)
Subjective:  Christine Ibarra is a 45 y.o. G2P1001 at [redacted]w[redacted]d being seen today for ongoing prenatal care.  She is currently monitored for the following issues for this high-risk pregnancy and has Late prenatal care affecting pregnancy in second trimester; Encounter for supervision of high-risk pregnancy with multigravida of advanced maternal age; History of cesarean delivery x1 ; Back pain in pregnancy; Excessive weight gain during pregnancy in second trimester; Abnormal fetal ultrasound; and AMA (advanced maternal age) multigravida 35+ on their problem list.  Patient reports general discomforts of pregnancy.  Contractions: Irregular. Vag. Bleeding: None.  Movement: Present. Denies leaking of fluid.   The following portions of the patient's history were reviewed and updated as appropriate: allergies, current medications, past family history, past medical history, past social history, past surgical history and problem list. Problem list updated.  Objective:   Vitals:   04/13/21 1021  BP: 110/75  Pulse: 90  Weight: 172 lb (78 kg)    Fetal Status: Fetal Heart Rate (bpm): 142   Movement: Present     General:  Alert, oriented and cooperative. Patient is in no acute distress.  Skin: Skin is warm and dry. No rash noted.   Cardiovascular: Normal heart rate noted  Respiratory: Normal respiratory effort, no problems with respiration noted  Abdomen: Soft, gravid, appropriate for gestational age. Pain/Pressure: Present     Pelvic:  Cervical exam deferred        Extremities: Normal range of motion.  Edema: Trace  Mental Status: Normal mood and affect. Normal behavior. Normal judgment and thought content.   Urinalysis:      Assessment and Plan:  Pregnancy: G2P1001 at [redacted]w[redacted]d  1. Encounter for supervision of high-risk pregnancy with multigravida of advanced maternal age Stable NST and AFI today Continue with weekly antenatal testing  2. History of cesarean delivery x1  Pt desires to postone  until May 28  She wants to give fetus more time to move into vertex position C section for this Sat rescheduled  3. Multigravida of advanced maternal age in third trimester See above  4. Malpresentation See above  Term labor symptoms and general obstetric precautions including but not limited to vaginal bleeding, contractions, leaking of fluid and fetal movement were reviewed in detail with the patient. Please refer to After Visit Summary for other counseling recommendations.  Return in about 1 week (around 04/20/2021) for OB visit, face to face, MD only, NST and AFI.   Hermina Staggers, MD

## 2021-04-13 NOTE — Progress Notes (Signed)
HROB she is concerned about breech presentation, also want to changed her scheduled (04/15/21) CS to 04/20/2021.

## 2021-04-14 ENCOUNTER — Encounter: Payer: Medicaid Other | Admitting: Obstetrics and Gynecology

## 2021-04-20 ENCOUNTER — Ambulatory Visit (INDEPENDENT_AMBULATORY_CARE_PROVIDER_SITE_OTHER): Payer: Medicaid Other | Admitting: Obstetrics

## 2021-04-20 ENCOUNTER — Other Ambulatory Visit (HOSPITAL_COMMUNITY)
Admission: RE | Admit: 2021-04-20 | Discharge: 2021-04-20 | Disposition: A | Discharge status: Home / Self Care | Payer: Medicaid Other | Source: Ambulatory Visit | Attending: Family Medicine | Admitting: Family Medicine

## 2021-04-20 ENCOUNTER — Other Ambulatory Visit: Payer: Self-pay

## 2021-04-20 ENCOUNTER — Encounter: Payer: Self-pay | Admitting: Obstetrics

## 2021-04-20 ENCOUNTER — Inpatient Hospital Stay (HOSPITAL_COMMUNITY)
Admission: RE | Admit: 2021-04-20 | Discharge: 2021-04-20 | Disposition: A | Discharge status: Home / Self Care | Payer: Medicaid Other | Source: Ambulatory Visit | Attending: Obstetrics and Gynecology | Admitting: Obstetrics and Gynecology

## 2021-04-20 ENCOUNTER — Ambulatory Visit (INDEPENDENT_AMBULATORY_CARE_PROVIDER_SITE_OTHER): Payer: Medicaid Other

## 2021-04-20 VITALS — BP 120/78 | HR 94 | Wt 177.3 lb

## 2021-04-20 DIAGNOSIS — Z20822 Contact with and (suspected) exposure to covid-19: Secondary | ICD-10-CM | POA: Insufficient documentation

## 2021-04-20 DIAGNOSIS — O09523 Supervision of elderly multigravida, third trimester: Secondary | ICD-10-CM

## 2021-04-20 DIAGNOSIS — Z98891 History of uterine scar from previous surgery: Secondary | ICD-10-CM

## 2021-04-20 DIAGNOSIS — O321XX0 Maternal care for breech presentation, not applicable or unspecified: Secondary | ICD-10-CM

## 2021-04-20 DIAGNOSIS — O09529 Supervision of elderly multigravida, unspecified trimester: Secondary | ICD-10-CM

## 2021-04-20 DIAGNOSIS — Z01812 Encounter for preprocedural laboratory examination: Secondary | ICD-10-CM | POA: Diagnosis present

## 2021-04-20 LAB — CBC
HCT: 34.5 % — ABNORMAL LOW (ref 36.0–46.0)
Hemoglobin: 11.5 g/dL — ABNORMAL LOW (ref 12.0–15.0)
MCH: 31.3 pg (ref 26.0–34.0)
MCHC: 33.3 g/dL (ref 30.0–36.0)
MCV: 94 fL (ref 80.0–100.0)
Platelets: 195 10*3/uL (ref 150–400)
RBC: 3.67 MIL/uL — ABNORMAL LOW (ref 3.87–5.11)
RDW: 14.6 % (ref 11.5–15.5)
WBC: 9 10*3/uL (ref 4.0–10.5)
nRBC: 0 % (ref 0.0–0.2)

## 2021-04-20 LAB — RPR: RPR Ser Ql: NONREACTIVE

## 2021-04-20 LAB — TYPE AND SCREEN
ABO/RH(D): O POS
Antibody Screen: NEGATIVE

## 2021-04-20 LAB — SARS CORONAVIRUS 2 (TAT 6-24 HRS): SARS Coronavirus 2: NEGATIVE

## 2021-04-20 NOTE — Progress Notes (Signed)
Pt presents for ROB/AFI/NST C/S scheduled 04/22/21

## 2021-04-20 NOTE — Progress Notes (Signed)
Subjective:  Christine Ibarra is a 45 y.o. G2P1001 at [redacted]w[redacted]d being seen today for ongoing prenatal care.  She is currently monitored for the following issues for this high risk pregnancy and has Late prenatal care affecting pregnancy in second trimester; Encounter for supervision of high-risk pregnancy with multigravida of advanced maternal age; History of cesarean delivery x1 ; Back pain in pregnancy; Excessive weight gain during pregnancy in second trimester; Abnormal fetal ultrasound; and AMA (advanced maternal age) multigravida 35+ on their problem list.  Patient reports no complaints.   .  .   . Denies leaking of fluid.   The following portions of the patient's history were reviewed and updated as appropriate: allergies, current medications, past family history, past medical history, past social history, past surgical history and problem list. Problem list updated.  Objective:  There were no vitals filed for this visit.  Fetal Status:           General:  Alert, oriented and cooperative. Patient is in no acute distress.  Skin: Skin is warm and dry. No rash noted.   Cardiovascular: Normal heart rate noted  Respiratory: Normal respiratory effort, no problems with respiration noted  Abdomen: Soft, gravid, appropriate for gestational age.       Pelvic:  Cervical exam deferred      Ultrasound confirms complete breech  Extremities: Normal range of motion.     Mental Status: Normal mood and affect. Normal behavior. Normal judgment and thought content.   Urinalysis:       NST:  Reactive.  FHR 150's.  Good variability.  15x15 accels.  No decels.  Occ. Contraction  Assessment and Plan:  Pregnancy: G2P1001 at [redacted]w[redacted]d  1. Encounter for supervision of high-risk pregnancy with multigravida of advanced maternal age  36. History of cesarean delivery x1  - desires repeat C/S  3. Breech presentation, single or unspecified fetus - repeat C/S scheduled 04/22/21  Term labor symptoms and general  obstetric precautions including but not limited to vaginal bleeding, contractions, leaking of fluid and fetal movement were reviewed in detail with the patient. Please refer to After Visit Summary for other counseling recommendations.   Return for Postpartum.   Brock Bad, MD  04/20/21

## 2021-04-20 NOTE — Progress Notes (Addendum)
Patient presents for NST and AFI for AMA.  FHR 140 AFI: 15.82  Patient was assessed and managed by nursing staff during this encounter. I have reviewed the chart and agree with the documentation and plan. I have also made any necessary editorial changes.  Coral Ceo, MD 04/21/2021 8:38 AM

## 2021-04-22 ENCOUNTER — Encounter (HOSPITAL_COMMUNITY)
Admission: RE | Disposition: A | Discharge status: Home / Self Care | Payer: Self-pay | Source: Home / Self Care | Attending: Family Medicine

## 2021-04-22 ENCOUNTER — Other Ambulatory Visit: Payer: Self-pay

## 2021-04-22 ENCOUNTER — Inpatient Hospital Stay (HOSPITAL_COMMUNITY): Payer: Medicaid Other | Admitting: Anesthesiology

## 2021-04-22 ENCOUNTER — Inpatient Hospital Stay (HOSPITAL_COMMUNITY)
Admission: RE | Admit: 2021-04-22 | Discharge: 2021-04-28 | DRG: 786 | Disposition: A | Discharge status: Home / Self Care | Payer: Medicaid Other | Attending: Family Medicine | Admitting: Family Medicine

## 2021-04-22 DIAGNOSIS — O34211 Maternal care for low transverse scar from previous cesarean delivery: Secondary | ICD-10-CM | POA: Diagnosis present

## 2021-04-22 DIAGNOSIS — Z23 Encounter for immunization: Secondary | ICD-10-CM

## 2021-04-22 DIAGNOSIS — O09529 Supervision of elderly multigravida, unspecified trimester: Secondary | ICD-10-CM

## 2021-04-22 DIAGNOSIS — O411230 Chorioamnionitis, third trimester, not applicable or unspecified: Secondary | ICD-10-CM | POA: Diagnosis present

## 2021-04-22 DIAGNOSIS — O34219 Maternal care for unspecified type scar from previous cesarean delivery: Secondary | ICD-10-CM | POA: Diagnosis present

## 2021-04-22 DIAGNOSIS — O480 Post-term pregnancy: Secondary | ICD-10-CM | POA: Diagnosis present

## 2021-04-22 DIAGNOSIS — Z3A40 40 weeks gestation of pregnancy: Secondary | ICD-10-CM

## 2021-04-22 DIAGNOSIS — O0932 Supervision of pregnancy with insufficient antenatal care, second trimester: Secondary | ICD-10-CM

## 2021-04-22 DIAGNOSIS — O321XX0 Maternal care for breech presentation, not applicable or unspecified: Principal | ICD-10-CM | POA: Diagnosis present

## 2021-04-22 DIAGNOSIS — Z98891 History of uterine scar from previous surgery: Secondary | ICD-10-CM

## 2021-04-22 DIAGNOSIS — O2602 Excessive weight gain in pregnancy, second trimester: Secondary | ICD-10-CM | POA: Diagnosis present

## 2021-04-22 SURGERY — Surgical Case
Anesthesia: Regional | Wound class: Clean Contaminated

## 2021-04-22 MED ORDER — ONDANSETRON HCL 4 MG/2ML IJ SOLN: 4.0000 mg | Freq: Four times a day (QID) | INTRAMUSCULAR | Status: DC | PRN

## 2021-04-22 MED ORDER — TERBUTALINE SULFATE 1 MG/ML IJ SOLN
INTRAMUSCULAR | Status: AC
  Filled 2021-04-22: qty 1

## 2021-04-22 MED ORDER — LACTATED RINGERS IV SOLN
INTRAVENOUS | Status: DC
  Administered 2021-04-22: 100 mL/h via INTRAVENOUS

## 2021-04-22 MED ORDER — TRANEXAMIC ACID-NACL 1000-0.7 MG/100ML-% IV SOLN: 1000.0000 mg | INTRAVENOUS | Status: DC

## 2021-04-22 MED ORDER — TERBUTALINE SULFATE 1 MG/ML IJ SOLN: 0.2500 mg | Freq: Once | INTRAMUSCULAR | Status: DC | PRN

## 2021-04-22 MED ORDER — EPHEDRINE 5 MG/ML INJ
INTRAVENOUS | Status: AC
  Filled 2021-04-22: qty 10

## 2021-04-22 MED ORDER — HYDROXYZINE HCL 50 MG PO TABS: 50.0000 mg | ORAL_TABLET | Freq: Four times a day (QID) | ORAL | Status: DC | PRN

## 2021-04-22 MED ORDER — EPHEDRINE 5 MG/ML INJ
10.0000 mg | INTRAVENOUS | Status: DC | PRN
  Filled 2021-04-22: qty 2

## 2021-04-22 MED ORDER — OXYTOCIN-SODIUM CHLORIDE 30-0.9 UT/500ML-% IV SOLN
1.0000 m[IU]/min | INTRAVENOUS | Status: DC
  Administered 2021-04-22: 1 m[IU]/min via INTRAVENOUS
  Administered 2021-04-24: 28 m[IU]/min via INTRAVENOUS
  Administered 2021-04-24: 2 m[IU]/min via INTRAVENOUS
  Filled 2021-04-22 (×2): qty 500

## 2021-04-22 MED ORDER — PHENYLEPHRINE 40 MCG/ML (10ML) SYRINGE FOR IV PUSH (FOR BLOOD PRESSURE SUPPORT)
80.0000 ug | PREFILLED_SYRINGE | INTRAVENOUS | Status: AC | PRN
  Administered 2021-04-22: 80 ug via INTRAVENOUS
  Administered 2021-04-22: 150 ug via INTRAVENOUS
  Administered 2021-04-23: 80 ug via INTRAVENOUS

## 2021-04-22 MED ORDER — PHENYLEPHRINE 40 MCG/ML (10ML) SYRINGE FOR IV PUSH (FOR BLOOD PRESSURE SUPPORT)
PREFILLED_SYRINGE | INTRAVENOUS | Status: AC
  Filled 2021-04-22: qty 10

## 2021-04-22 MED ORDER — DEXAMETHASONE SODIUM PHOSPHATE 4 MG/ML IJ SOLN
INTRAMUSCULAR | Status: AC
  Filled 2021-04-22: qty 2

## 2021-04-22 MED ORDER — OXYTOCIN-SODIUM CHLORIDE 30-0.9 UT/500ML-% IV SOLN
INTRAVENOUS | Status: AC
  Filled 2021-04-22: qty 500

## 2021-04-22 MED ORDER — ONDANSETRON HCL 4 MG/2ML IJ SOLN
INTRAMUSCULAR | Status: AC
  Filled 2021-04-22: qty 2

## 2021-04-22 MED ORDER — FENTANYL CITRATE (PF) 100 MCG/2ML IJ SOLN: 50.0000 ug | INTRAMUSCULAR | Status: DC | PRN

## 2021-04-22 MED ORDER — DIPHENHYDRAMINE HCL 50 MG/ML IJ SOLN: 12.5000 mg | INTRAMUSCULAR | Status: DC | PRN

## 2021-04-22 MED ORDER — LACTATED RINGERS IV SOLN: INTRAVENOUS | Status: DC

## 2021-04-22 MED ORDER — MORPHINE SULFATE (PF) 0.5 MG/ML IJ SOLN
INTRAMUSCULAR | Status: AC
  Filled 2021-04-22: qty 10

## 2021-04-22 MED ORDER — OXYTOCIN BOLUS FROM INFUSION: 333.0000 mL | Freq: Once | INTRAVENOUS | Status: DC

## 2021-04-22 MED ORDER — OXYTOCIN-SODIUM CHLORIDE 30-0.9 UT/500ML-% IV SOLN: 2.5000 [IU]/h | INTRAVENOUS | Status: DC

## 2021-04-22 MED ORDER — PHENYLEPHRINE 40 MCG/ML (10ML) SYRINGE FOR IV PUSH (FOR BLOOD PRESSURE SUPPORT)
80.0000 ug | PREFILLED_SYRINGE | INTRAVENOUS | Status: AC | PRN
  Administered 2021-04-23 – 2021-04-24 (×3): 80 ug via INTRAVENOUS
  Filled 2021-04-22: qty 10

## 2021-04-22 MED ORDER — LACTATED RINGERS IV SOLN: 500.0000 mL | INTRAVENOUS | Status: DC | PRN

## 2021-04-22 MED ORDER — METOCLOPRAMIDE HCL 5 MG/ML IJ SOLN
INTRAMUSCULAR | Status: AC
  Filled 2021-04-22: qty 2

## 2021-04-22 MED ORDER — EPHEDRINE 5 MG/ML INJ
10.0000 mg | INTRAVENOUS | Status: DC | PRN
  Administered 2021-04-22: 15 mg via INTRAVENOUS
  Filled 2021-04-22 (×2): qty 2

## 2021-04-22 MED ORDER — LIDOCAINE-EPINEPHRINE (PF) 2 %-1:200000 IJ SOLN
INTRAMUSCULAR | Status: DC | PRN
  Administered 2021-04-22 – 2021-04-25 (×2): 10 mL via EPIDURAL
  Administered 2021-04-25 (×2): 5 mL via EPIDURAL

## 2021-04-22 MED ORDER — CEFAZOLIN SODIUM-DEXTROSE 2-4 GM/100ML-% IV SOLN
INTRAVENOUS | Status: AC
  Filled 2021-04-22: qty 100

## 2021-04-22 MED ORDER — LIDOCAINE HCL (PF) 1 % IJ SOLN: 30.0000 mL | INTRAMUSCULAR | Status: DC | PRN

## 2021-04-22 MED ORDER — POVIDONE-IODINE 10 % EX SWAB: 2.0000 "application " | Freq: Once | CUTANEOUS | Status: DC

## 2021-04-22 MED ORDER — GABAPENTIN 300 MG PO CAPS
ORAL_CAPSULE | ORAL | Status: AC
  Filled 2021-04-22: qty 1

## 2021-04-22 MED ORDER — LACTATED RINGERS IV SOLN
500.0000 mL | Freq: Once | INTRAVENOUS | Status: AC
  Administered 2021-04-22: 500 mL via INTRAVENOUS

## 2021-04-22 MED ORDER — PHENYLEPHRINE HCL-NACL 20-0.9 MG/250ML-% IV SOLN
INTRAVENOUS | Status: AC
  Filled 2021-04-22: qty 250

## 2021-04-22 MED ORDER — FENTANYL-BUPIVACAINE-NACL 0.5-0.125-0.9 MG/250ML-% EP SOLN
12.0000 mL/h | EPIDURAL | Status: DC | PRN
Start: 2021-04-22 — End: 2021-04-25
  Administered 2021-04-23 – 2021-04-25 (×3): 12 mL/h via EPIDURAL
  Filled 2021-04-22 (×3): qty 250

## 2021-04-22 MED ORDER — SOD CITRATE-CITRIC ACID 500-334 MG/5ML PO SOLN
ORAL | Status: AC
  Filled 2021-04-22: qty 30

## 2021-04-22 MED ORDER — FENTANYL CITRATE (PF) 100 MCG/2ML IJ SOLN
INTRAMUSCULAR | Status: AC
  Filled 2021-04-22: qty 2

## 2021-04-22 MED ORDER — LIDOCAINE-EPINEPHRINE (PF) 2 %-1:200000 IJ SOLN
INTRAMUSCULAR | Status: AC | PRN
  Administered 2021-04-22: 10 mL via EPIDURAL

## 2021-04-22 MED ORDER — ACETAMINOPHEN 325 MG PO TABS: 650.0000 mg | ORAL_TABLET | ORAL | Status: DC | PRN

## 2021-04-22 MED ORDER — SOD CITRATE-CITRIC ACID 500-334 MG/5ML PO SOLN: 30.0000 mL | ORAL | Status: DC | PRN

## 2021-04-22 MED ORDER — TERBUTALINE SULFATE 1 MG/ML IJ SOLN
0.2500 mg | Freq: Once | INTRAMUSCULAR | Status: AC
  Administered 2021-04-22: 0.25 mg via SUBCUTANEOUS

## 2021-04-22 MED ORDER — GABAPENTIN 300 MG PO CAPS
300.0000 mg | ORAL_CAPSULE | ORAL | Status: AC
  Administered 2021-04-22: 300 mg via ORAL

## 2021-04-22 MED ORDER — CEFAZOLIN SODIUM-DEXTROSE 2-4 GM/100ML-% IV SOLN: 2.0000 g | INTRAVENOUS | Status: DC

## 2021-04-22 MED ORDER — ZOLPIDEM TARTRATE 5 MG PO TABS: 5.0000 mg | ORAL_TABLET | Freq: Every evening | ORAL | Status: DC | PRN

## 2021-04-22 MED ORDER — SOD CITRATE-CITRIC ACID 500-334 MG/5ML PO SOLN: 30.0000 mL | ORAL | Status: DC

## 2021-04-22 MED ORDER — ACETAMINOPHEN 500 MG PO TABS
ORAL_TABLET | ORAL | Status: AC
  Filled 2021-04-22: qty 2

## 2021-04-22 MED ORDER — ACETAMINOPHEN 500 MG PO TABS
1000.0000 mg | ORAL_TABLET | ORAL | Status: AC
  Administered 2021-04-22: 1000 mg via ORAL

## 2021-04-22 NOTE — Progress Notes (Signed)
Labor Progress Note Christine Ibarra is a 45 y.o. G2P1001 at [redacted]w[redacted]d presented for RCS for Breech and is s/p successful ECV and now IOL for AMA  S: having occasional contractions, has eaten and agrees to attempted FB  O:  BP (!) 108/93   Pulse (!) 116   Temp 98.5 F (36.9 C) (Oral)   Resp 16   Ht 5\' 2"  (1.575 m)   Wt 77.9 kg   LMP 07/07/2020 (Approximate)   SpO2 100%   BMI 31.42 kg/m  EFM: 145/mod/+accels and no decels  CVE: Dilation: 1 Effacement (%): 50 Station: -3,Ballotable Presentation: Vertex Exam by:: [name] MD   Attempted FB with speculum but has collapsing vaginal walls and very posterior cervix. Attempted to inflate Cooks x2 unsuccessfully. Attempted manual placement but patient was not tolerating. I was able to confirm infant remained cephalic.    A&P: 83 y.o. G2P1001 [redacted]w[redacted]d IOL for AMA  #Labor:  Discussed need for cervical ripening and inability to place FB. Patient agreed to low dose pitocin 1x1 with max of 6. She would like to walk which I think it is great plan.  Recommended another attempt at foley bulb in 4-6 hours. I think the patient needs fetal head better applied to cervix to help with ripening.   #ECV successful-- abdominal binder in place.   #TOLAC- very much wants vaginal birth. CSx1 for arrest of dilation at 2 cm  #Pain: coping well, has doula Amelia at bedside who helps with exams and helping clarify patient's goals.  #FWB: Cat 1 #GBS negative  [redacted]w[redacted]d, MD 3:24 PM

## 2021-04-22 NOTE — Anesthesia Preprocedure Evaluation (Addendum)
Anesthesia Evaluation  Patient identified by MRN, date of birth, ID band Patient awake    Reviewed: Allergy & Precautions, NPO status , Patient's Chart, lab work & pertinent test results  Airway Mallampati: II  TM Distance: >3 FB Neck ROM: Full    Dental no notable dental hx.    Pulmonary neg pulmonary ROS,    Pulmonary exam normal breath sounds clear to auscultation       Cardiovascular negative cardio ROS Normal cardiovascular exam Rhythm:Regular Rate:Normal     Neuro/Psych  Headaches, negative neurological ROS  negative psych ROS   GI/Hepatic negative GI ROS, Neg liver ROS,   Endo/Other  negative endocrine ROS  Renal/GU negative Renal ROS  negative genitourinary   Musculoskeletal negative musculoskeletal ROS (+)   Abdominal   Peds  Hematology negative hematology ROS (+)   Anesthesia Other Findings Repeat C/S, breech presentation  Reproductive/Obstetrics (+) Pregnancy                           Anesthesia Physical Anesthesia Plan  ASA: II  Anesthesia Plan: Epidural   Post-op Pain Management:    Induction:   PONV Risk Score and Plan: Treatment may vary due to age or medical condition  Airway Management Planned: Natural Airway  Additional Equipment:   Intra-op Plan:   Post-operative Plan:   Informed Consent: I have reviewed the patients History and Physical, chart, labs and discussed the procedure including the risks, benefits and alternatives for the proposed anesthesia with the patient or authorized representative who has indicated his/her understanding and acceptance.     Dental advisory given  Plan Discussed with: CRNA  Anesthesia Plan Comments: (Plan to place epidural for version attempt. If unsuccessful, plan for repeat C/S using epidural. If successful, plan for L&D for induction. )       Anesthesia Quick Evaluation  

## 2021-04-22 NOTE — H&P (Signed)
OBSTETRIC ADMISSION HISTORY AND PHYSICAL  Christine Ibarra is a 45 y.o. female G2P1001 with IUP at [redacted]w[redacted]d by L/21 presenting for IOL vs repeat Cesarean s/p attempt at ECV given breech fetal presentation. She reports +FMs, No LOF, no VB, no blurry vision, headaches or peripheral edema, and RUQ pain.  She plans on breast feeding. She is considering Nexplanon for birth control.  She received her prenatal care at [facility]   Dating: By L/21 --->  Estimated Date of Delivery: 04/20/21  Sono:  @[redacted]w[redacted]d , CWD, normal anatomy, breech presentation, 3750g, 91% EFW   Prenatal History/Complications:  - Low lying placenta (now resolved) - H/o Cesarean x1 (arrest of dilation) - Fetal PACs - AMA  Past Medical History: Past Medical History:  Diagnosis Date  . Headache   . Pneumonia   . Umbilical hernia     Past Surgical History: Past Surgical History:  Procedure Laterality Date  . CESAREAN SECTION      Obstetrical History: OB History    Gravida  2   Para  1   Term  1   Preterm      AB      Living  1     SAB      IAB      Ectopic      Multiple      Live Births  1           Social History Social History   Socioeconomic History  . Marital status: Married    Spouse name: Not on file  . Number of children: Not on file  . Years of education: Not on file  . Highest education level: Not on file  Occupational History  . Not on file  Tobacco Use  . Smoking status: Never Smoker  . Smokeless tobacco: Never Used  Vaping Use  . Vaping Use: Never used  Substance and Sexual Activity  . Alcohol use: Not Currently  . Drug use: Not Currently  . Sexual activity: Yes  Other Topics Concern  . Not on file  Social History Narrative  . Not on file   Social Determinants of Health   Financial Resource Strain: Not on file  Food Insecurity: Not on file  Transportation Needs: Not on file  Physical Activity: Not on file  Stress: Not on file  Social Connections: Not on file     Family History: Family History  Problem Relation Age of Onset  . Hypertension Mother   . Diabetes Father     Allergies: No Known Allergies  Medications Prior to Admission  Medication Sig Dispense Refill Last Dose  . ferrous sulfate 324 (65 Fe) MG TBEC Take 1 tablet (325 mg total) by mouth every other day. (Patient not taking: Reported on 04/20/2021) 30 tablet 3   . Prenatal Vit-Fe Fumarate-FA (MULTIVITAMIN-PRENATAL) 27-0.8 MG TABS tablet Take 1 tablet by mouth in the morning.        Review of Systems   All systems reviewed and negative except as stated in HPI  Blood pressure 118/74, pulse (!) 118, temperature 99.5 F (37.5 C), temperature source Oral, resp. rate 17, height 5\' 2"  (1.575 m), weight 77.9 kg, last menstrual period 07/07/2020, SpO2 100 %. General appearance: alert, cooperative and appears stated age Lungs: normal WOB Heart: regular rate  Abdomen: soft, non-tender Extremities: no sign of DVT Presentation: frank breech Fetal monitoring: baseline 125/moderate variability/+accels/no decels Toco: irregular contractions Dilation: 1 Effacement (%): 50 Station: -3,Ballotable Exam by:: MD 09/06/2020 Prenatal labs: ABO, Rh: --/--/O  POS (05/26 0930) Antibody: NEG (05/26 0930) Rubella: 1.20 (01/12 1312) RPR: NON REACTIVE (05/26 0931)  HBsAg: Negative (01/12 1312)  HIV: Non Reactive (03/09 0946)  GBS: Negative/-- (05/13 1136)  2 hr Glucola wnl Genetic screening wnl Anatomy US wnl except for low lying placenta  Prenatal Transfer Tool  Maternal Diabetes: No Genetic Screening: Normal Maternal Ultrasounds/Referrals: Normal Fetal Ultrasounds or other Referrals:  None Maternal Substance Abuse:  No Significant Maternal Medications:  None Significant Maternal Lab Results: Group B Strep negative  No results found for this or any previous visit (from the past 24 hour(s)).  Patient Active Problem List   Diagnosis Date Noted  . Previous cesarean delivery, antepartum  04/22/2021  . AMA (advanced maternal age) multigravida 35+ 03/22/2021  . Abnormal fetal ultrasound 02/01/2021  . History of cesarean delivery x1  01/04/2021  . Back pain in pregnancy 01/04/2021  . Excessive weight gain during pregnancy in second trimester 01/04/2021  . Late prenatal care affecting pregnancy in second trimester 12/07/2020  . Encounter for supervision of high-risk pregnancy with multigravida of advanced maternal age 39/10/2021    Assessment/Plan:  Christine Ibarra is a 45 y.o. G2P1001 at [redacted]w[redacted]d here for IOL vs repeat Cesarean s/p attempt at ECV given breech fetal presentation.  #Breech Fetal Presentation  AMA: S/p shared decision making with Dr. Alvester Morin on admission, pt elected to undergo ECV s/p placement of epidural. Discussed with pt plan for repeat Cesarean versus IOL depending on result of ECV. Pt was consented for repeat Cesarean prior to ECV as noted below.  The risks of cesarean section were discussed with the patient including but were not limited to: bleeding which may require transfusion or reoperation; infection which may require antibiotics; injury to bowel, bladder, ureters or other surrounding organs; injury to the fetus; need for additional procedures including hysterectomy in the event of a life-threatening hemorrhage; placental abnormalities wth subsequent pregnancies, incisional problems, thromboembolic phenomenon and other postoperative/anesthesia complications.  The patient concurred with the proposed plan, giving informed written consent for the procedure. Patient has been NPO since yesterday; she will remain NPO for procedure. Anesthesia and OR aware.   ECV was SUCCESSFUL!!! Binder was placed in pre-op  #Labor: patient is very nervous about induction. Lengthy discussion about options and recommendation for IOL based on age> 73 yo. Patient open to foley bulb placement. Discussed starting low dose pitocin, patient does not want this right now but open to  discussion in 6 hrs.  #Pain: Epidural catheter in place- capped by anesthesia after ECV #FWB:  Category 1 strip #ID: GBS negative #MOF: breast #MOC: undecided; pt considering Nexplanon #Circ: n/a  Federico Flake, MD OB Fellow, [facility] 04/22/2021 12:55 PM

## 2021-04-22 NOTE — Anesthesia Procedure Notes (Deleted)
Epidural Patient location during procedure: pre-op Start time: 04/22/2021 10:55 AM End time: 04/22/2021 11:05 AM  Staffing Anesthesiologist: Elmer Picker, MD Performed: anesthesiologist   Preanesthetic Checklist Completed: patient identified, IV checked, risks and benefits discussed, monitors and equipment checked, pre-op evaluation and timeout performed  Epidural Patient position: sitting Prep: DuraPrep and site prepped and draped Patient monitoring: continuous pulse ox, blood pressure, heart rate and cardiac monitor Approach: midline Location: L3-L4 Injection technique: LOR air  Needle:  Needle type: Tuohy  Needle gauge: 17 G Needle length: 9 cm Needle insertion depth: 5 cm Catheter type: closed end flexible Catheter size: 19 Gauge Catheter at skin depth: 10 cm Test dose: negative  Assessment Sensory level: T8 Events: blood not aspirated, injection not painful, no injection resistance, no paresthesia and negative IV test  Additional Notes Patient identified. Risks/Benefits/Options discussed with patient including but not limited to bleeding, infection, nerve damage, paralysis, failed block, incomplete pain control, headache, blood pressure changes, nausea, vomiting, reactions to medication both or allergic, itching and postpartum back pain. Confirmed with bedside nurse the patient's most recent platelet count. Confirmed with patient that they are not currently taking any anticoagulation, have any bleeding history or any family history of bleeding disorders. Patient expressed understanding and wished to proceed. All questions were answered. Sterile technique was used throughout the entire procedure. Please see nursing notes for vital signs. Test dose was given through epidural catheter and negative prior to continuing to dose epidural or start infusion. Warning signs of high block given to the patient including shortness of breath, tingling/numbness in hands, complete motor  block, or any concerning symptoms with instructions to call for help. Patient was given instructions on fall risk and not to get out of bed. All questions and concerns addressed with instructions to call with any issues or inadequate analgesia.  Reason for block:procedure for pain

## 2021-04-22 NOTE — Progress Notes (Signed)
Patient ID: Christine Ibarra, female   DOB: 1976-02-02, 45 y.o.   MRN: [id number]  Labor Progress Note Christine Ibarra is a 45 y.o. G2P1001 at [redacted]w[redacted]d presented for RCS for Breech and is s/p successful ECV and now IOL for AMA  S: resting comfortably.  Walked around and now in flying cowgirl position. Feeling contractions but able to sleep.   O:  BP 109/76   Pulse 100   Temp 98.1 F (36.7 C) (Oral)   Resp 16   Ht 5\' 2"  (1.575 m)   Wt 77.9 kg   LMP 07/07/2020 (Approximate)   SpO2 100%   BMI 31.42 kg/m  EFM: 145/mod/+accels and no decels  CVE: Dilation: 1 Effacement (%): 50 Station: -3,Ballotable Presentation: Vertex Exam by:: Briscoe Daniello MD    A&P: 45 y.o. G2P1001 [redacted]w[redacted]d IOL for AMA  #Labor:  Continue low dose pit for cervical ripening. Retry FB at a later time  #ECV successful-- abdominal binder in place.   #TOLAC- very much wants vaginal birth. CSx1 for arrest of dilation at 2 cm  #Pain: coping well, has doula Amelia at bedside who helps with exams and helping clarify patient's goals.  #FWB: Cat 1 #GBS negative  [redacted]w[redacted]d, MD 7:16 PM

## 2021-04-22 NOTE — Anesthesia Procedure Notes (Signed)
Epidural Patient location during procedure: OB Start time: 04/22/2021 10:55 AM End time: 04/22/2021 11:05 AM  Staffing Anesthesiologist: Elmer Picker, MD Performed: anesthesiologist   Preanesthetic Checklist Completed: patient identified, IV checked, risks and benefits discussed, monitors and equipment checked, pre-op evaluation and timeout performed  Epidural Patient position: sitting Prep: DuraPrep and site prepped and draped Patient monitoring: continuous pulse ox, blood pressure, heart rate and cardiac monitor Approach: midline Location: L3-L4 Injection technique: LOR air  Needle:  Needle type: Tuohy  Needle gauge: 17 G Needle length: 9 cm Needle insertion depth: 5 cm Catheter type: closed end flexible Catheter size: 19 Gauge Catheter at skin depth: 10 cm Test dose: negative  Assessment Sensory level: T8 Events: blood not aspirated, injection not painful, no injection resistance, no paresthesia and negative IV test  Additional Notes Patient identified. Risks/Benefits/Options discussed with patient including but not limited to bleeding, infection, nerve damage, paralysis, failed block, incomplete pain control, headache, blood pressure changes, nausea, vomiting, reactions to medication both or allergic, itching and postpartum back pain. Confirmed with bedside nurse the patient's most recent platelet count. Confirmed with patient that they are not currently taking any anticoagulation, have any bleeding history or any family history of bleeding disorders. Patient expressed understanding and wished to proceed. All questions were answered. Sterile technique was used throughout the entire procedure. Please see nursing notes for vital signs. Test dose was given through epidural catheter and negative prior to continuing to dose epidural or start infusion. Warning signs of high block given to the patient including shortness of breath, tingling/numbness in hands, complete motor block,  or any concerning symptoms with instructions to call for help. Patient was given instructions on fall risk and not to get out of bed. All questions and concerns addressed with instructions to call with any issues or inadequate analgesia.  Reason for block:procedure for pain

## 2021-04-22 NOTE — Progress Notes (Signed)
Labor Progress Note Teresina Middendorf is a 45 y.o. G2P1001 at [redacted]w[redacted]d presented for IOL for AMA (initially rCS for breech but s/p successful ECV) S: Has been walking around in the hallway. Feeling some pain in her back, unsure if related to contractions or abdominal binder.  O:  BP 106/80 (BP Location: Left Arm)   Pulse (!) 115   Temp 98.1 F (36.7 C) (Oral)   Resp 18   Ht 5\' 2"  (1.575 m)   Wt 77.9 kg   LMP 07/07/2020 (Approximate)   SpO2 100%   BMI 31.42 kg/m  EFM: baseline 135/moderate variability/+accels/no decels Toco: no contractions  CVE: Dilation: 1 Effacement (%): 50 Station: -3,Ballotable Presentation: Vertex Exam by:: [email or web address], CNM  BSUS confirmed still cephalic  A&P: 45 y.o. G2P1001 [redacted]w[redacted]d now for TOLAC and IOL for AMA #Labor: Latent. Has remained on low dose Pitocin for cervical ripening. FB successfully placed, will discontinue Pitocin for now. #ECV successful: continue abdominal binder until fetal head engaged #Pain: coping well, declines epidural but epidural tubing remains in the event CS needed #FWB: cat I #GBS negative  [redacted]w[redacted]d, MD 10:51 PM

## 2021-04-22 NOTE — Progress Notes (Signed)
MD Alvester Morin, MD Daniel Nones RN, Sydnee Levans RN at bedside for external cephalic version attempt

## 2021-04-22 NOTE — Procedures (Signed)
Christine Ibarra is G2P1001 at [redacted]w[redacted]d presenting for ECV vs RCS. Extensive counseling provided about risks/benefits of both procedures. Patient was given time to think about options and we coordinated with anesthesia as well.   Patient expressed desire for ECV attempt. We discussed a plan to have regional anesthesia placed prior to ECV attempt to help increase likelihood of success.   After informed verbal consent, patient signed consent form.  RN administered Terbutaline 0.25 mg SQ  Patient was placed in the supine position. ECV was attempted under Ultrasound guidance.  Fetus was breech with fetal head in the LUQ and spine on maternal left. Frank breech position. I elevated the breech out of the pelvis and I attempted a forward roll with good movement of fetal head. FHR was checked via [place] 3 times during procedure and progress of the fetal head also monitor.  I have able to convert the fetus to cephalic, [place] confirmed and confirmed by cervical exam.   ECV was successfully performed.   FHR was reactive before and after the procedure.   Pt. Tolerated the procedure well.  Dilation: 1 Effacement (%): 50 Station: -3,Ballotable Presentation: Vertex Exam by:: MD Alvester Morin  Discussed membrane sweeping today. Reviewed cochrane review data on membrane sweeping at 39 wks and then at EDD. Reviewed risk of cramping, contractions, bleeding and ROM. Answered patient questions and she agreed to proceed with procedure.   Plan: After lengthy discussion about indications for IOL patient agrees to induction today Membrane sweeping performed Will place FB when on LD After 6 hours plan to discuss low dose pitocin Patient with past trauma for failed induction -- "arrest of dilation" but was only 2 cm and only received pitocin and no FB/cytotec per patient.

## 2021-04-22 NOTE — Progress Notes (Signed)
Version successful, abdominal binder applied, will continue to monitor

## 2021-04-22 NOTE — Anesthesia Preprocedure Evaluation (Deleted)
Anesthesia Evaluation  Patient identified by MRN, date of birth, ID band Patient awake    Reviewed: Allergy & Precautions, NPO status , Patient's Chart, lab work & pertinent test results  Airway Mallampati: II  TM Distance: >3 FB Neck ROM: Full    Dental no notable dental hx.    Pulmonary neg pulmonary ROS,    Pulmonary exam normal breath sounds clear to auscultation       Cardiovascular negative cardio ROS Normal cardiovascular exam Rhythm:Regular Rate:Normal     Neuro/Psych  Headaches, negative neurological ROS  negative psych ROS   GI/Hepatic negative GI ROS, Neg liver ROS,   Endo/Other  negative endocrine ROS  Renal/GU negative Renal ROS  negative genitourinary   Musculoskeletal negative musculoskeletal ROS (+)   Abdominal   Peds  Hematology negative hematology ROS (+)   Anesthesia Other Findings Repeat C/S, breech presentation  Reproductive/Obstetrics (+) Pregnancy                           Anesthesia Physical Anesthesia Plan  ASA: II  Anesthesia Plan: Epidural   Post-op Pain Management:    Induction:   PONV Risk Score and Plan: Treatment may vary due to age or medical condition  Airway Management Planned: Natural Airway  Additional Equipment:   Intra-op Plan:   Post-operative Plan:   Informed Consent: I have reviewed the patients History and Physical, chart, labs and discussed the procedure including the risks, benefits and alternatives for the proposed anesthesia with the patient or authorized representative who has indicated his/her understanding and acceptance.     Dental advisory given  Plan Discussed with: CRNA  Anesthesia Plan Comments: (Plan to place epidural for version attempt. If unsuccessful, plan for repeat C/S using epidural. If successful, plan for L&D for induction. )       Anesthesia Quick Evaluation

## 2021-04-23 ENCOUNTER — Encounter (HOSPITAL_COMMUNITY): Payer: Self-pay | Admitting: Family Medicine

## 2021-04-23 MED ORDER — LIDOCAINE HCL (PF) 1 % IJ SOLN: 30.0000 mL | INTRAMUSCULAR | Status: DC | PRN

## 2021-04-23 MED ORDER — ACETAMINOPHEN 325 MG PO TABS: 650.0000 mg | ORAL_TABLET | ORAL | Status: DC | PRN

## 2021-04-23 MED ORDER — LACTATED RINGERS IV SOLN: INTRAVENOUS | Status: DC

## 2021-04-23 MED ORDER — FENTANYL CITRATE (PF) 100 MCG/2ML IJ SOLN: 100.0000 ug | INTRAMUSCULAR | Status: DC | PRN

## 2021-04-23 MED ORDER — SOD CITRATE-CITRIC ACID 500-334 MG/5ML PO SOLN
30.0000 mL | ORAL | Status: DC | PRN
  Administered 2021-04-24 – 2021-04-25 (×2): 30 mL via ORAL
  Filled 2021-04-23 (×2): qty 15

## 2021-04-23 MED ORDER — HYDROXYZINE HCL 50 MG PO TABS: 50.0000 mg | ORAL_TABLET | Freq: Four times a day (QID) | ORAL | Status: DC | PRN

## 2021-04-23 MED ORDER — OXYCODONE-ACETAMINOPHEN 5-325 MG PO TABS: 2.0000 | ORAL_TABLET | ORAL | Status: DC | PRN

## 2021-04-23 MED ORDER — ONDANSETRON HCL 4 MG/2ML IJ SOLN
INTRAMUSCULAR | Status: AC
  Administered 2021-04-23: 4 mg
  Filled 2021-04-23: qty 2

## 2021-04-23 MED ORDER — OXYTOCIN BOLUS FROM INFUSION: 333.0000 mL | Freq: Once | INTRAVENOUS | Status: DC

## 2021-04-23 MED ORDER — OXYCODONE-ACETAMINOPHEN 5-325 MG PO TABS: 1.0000 | ORAL_TABLET | ORAL | Status: DC | PRN

## 2021-04-23 MED ORDER — OXYTOCIN-SODIUM CHLORIDE 30-0.9 UT/500ML-% IV SOLN
2.5000 [IU]/h | INTRAVENOUS | Status: DC
  Filled 2021-04-23 (×2): qty 500

## 2021-04-23 MED ORDER — FLEET ENEMA 7-19 GM/118ML RE ENEM: 1.0000 | ENEMA | RECTAL | Status: DC | PRN

## 2021-04-23 MED ORDER — ONDANSETRON HCL 4 MG/2ML IJ SOLN: 4.0000 mg | Freq: Four times a day (QID) | INTRAMUSCULAR | Status: DC | PRN

## 2021-04-23 NOTE — Progress Notes (Signed)
RN at bedside, pt notified RN that she has ordered a regular tray for breakfast. Pt educated on clear liquids policy while on pitocin, epidural  placement, and TOLAC. Pt and doula extensively educated on risk vs benefit of intake of regular diet during induction of labor as a TOLAC. Pt verbalized understanding. ANMD notified and attending notified.   Luna Fuse

## 2021-04-23 NOTE — Progress Notes (Addendum)
S: Doing well, pain being managed with Epidural. She denies pelvic pressure or pain. Christine Ibarra at bedside.    O: Vitals:   04/23/21 2000 04/23/21 2025 04/23/21 2035 04/23/21 2100  BP: (!) 67/43  102/68 (!) 81/57  Pulse: 92  84 74  Resp: 18  16 18   Temp:  98.7 F (37.1 C)    TempSrc:  Axillary    SpO2: 97%   95%  Weight:      Height:         FHT:  FHR: 135 bpm, variability: moderate,  accelerations:  Present,  decelerations:  Present early decelrations UC:   Regular every 2-4 minutes. MVU's 150's-160's.  SVE:   Dilation: 4.5 Effacement (%): 50 Station: -3 Exam by:: [email or web address], CNM   A / P: Induction of labor due to AMA. S/p cytotec and Foley balloon. Pitocin at 24 mili/units and MVUs inadequate at between 150's to 160's.  -Plan to continue to increase pitocin PRN.  Fetal Wellbeing:  Category II   -Continue to monitor for sx of distress.  Pain Control:  Epidural  Anticipated MOD:  Cautious for VBAC, EFW 8#4oz on 5/11. Previous c/s with 9#1oz baby.   Christine Ibarra 7/11) Danella Deis, BSN, RNC-OB  Student Nurse-Midwife   04/23/2021  9:21 PM

## 2021-04-23 NOTE — Progress Notes (Signed)
Patient ID: Christine Ibarra, female   DOB: 05/13/1976, 45 y.o.   MRN: [id number] Foley still in cervix I decided to restart Pitocin to see if contractions will help cervix efface  Patient and Doula are wanting to eat a full breakfast Dr Miguel Rota went in and told patient our protocol dictates clear liquids in a TOLAC who is being induced  They still insist since she is in early labor, she wants to eat Will discuss with Dr Shawnie Pons this am

## 2021-04-23 NOTE — Progress Notes (Signed)
Patient ID: Christine Ibarra, female   DOB: [birth year], 45 y.o.   MRN: [id number] Patient comfortable with epidural  Discussed need to check her to evaluate progress and pelvis size.  Vitals:   04/23/21 2138 04/23/21 2200 04/23/21 2230 04/23/21 2300  BP: 103/71 (!) 92/54 108/72 103/66  Pulse: 80 78 88 84  Resp: 18     Temp:      TempSrc:      SpO2: 99% 95% 99% 95%  Weight:      Height:       FHR reassuring UCs regular, moderately strong  Dilation: 5 Effacement (%): 70 Cervical Position: Middle Station: -2 Presentation: Vertex Exam by:: [employer] is a little tight, Interspinous diameter about 9cm Diag conjugate 12cm, Pubic arch flattened but not encroaching  Discussed with Dr Shawnie Pons Will increase Pitocin to 66mu/min max  Recheck at 0200 hrs.   Aviva Signs, CNM

## 2021-04-23 NOTE — Progress Notes (Addendum)
Patient ID: Christine Ibarra, female   DOB: 1976/11/02, 45 y.o.   MRN: [id number] Christine Ibarra is a 45 y.o. G2P1001 at [redacted]w[redacted]d admitted for induction of labor due to advanced maternal age  Subjective: uncomfortable w/ contractions and unsure what she wants for pain management  Objective: BP (!) 130/102   Pulse 64   Temp 98.5 F (36.9 C) (Oral)   Resp 18   Ht 5\' 2"  (1.575 m)   Wt 77.9 kg   LMP 07/07/2020 (Approximate)   SpO2 100%   BMI 31.42 kg/m  No intake/output data recorded.  FHR baseline 135 bpm, Variability: moderate, Accelerations:present, Decelerations:  Absent Toco: regular, every 2-3 minutes  MVUs 140s-150s  SVE:   Dilation: 4.5 Effacement (%): 50 Station: -3 Exam by:: [email or web address], CNM  Pitocin @ 20 mu/min  Labs: Lab Results  Component Value Date   WBC 9.0 04/20/2021   HGB 11.5 (L) 04/20/2021   HCT 34.5 (L) 04/20/2021   MCV 94.0 04/20/2021   PLT 195 04/20/2021    Assessment / Plan: IOL d/t AMA, s/p foley bulb, pit @ 20, MVUs inadequate- has good regular pattern and uncomfortable w/ uc's. Trying to decide on pain management, discussed all options- wants to discuss w/ her husband who is at home w/ their other child. Doula has done Walcher's position w/ pt earlier to try to encourage baby to engage. Pt concerned baby may be too big to fit through pelvis (last baby 9lb1oz), this baby 8lb4oz 2.5wks ago. Will let 04/22/2021 know what she is thinking after she discusses w/ husband.   Labor: early Fetal Wellbeing:  Category I Pain Control:  labor support without medications Pre-eclampsia: N/A I/D:  GBS neg Anticipated MOD: NSVB  [place] CNM, WHNP-BC 04/23/2021, [phone]

## 2021-04-23 NOTE — Progress Notes (Addendum)
Patient ID: Christine Ibarra, female   DOB: [birth year], 45 y.o.   MRN: [id number] Christine Ibarra is a 45 y.o. G2P1001 at [redacted]w[redacted]d admitted for induction of labor due to advanced maternal age and n/a  Subjective: wants to eat breakfast, feeling contractions some  Objective: BP (!) 93/50   Pulse 82   Temp (!) 97.3 F (36.3 C) (Axillary)   Resp 18   Ht 5\' 2"  (1.575 m)   Wt 77.9 kg   LMP 07/07/2020 (Approximate)   SpO2 100%   BMI 31.42 kg/m  No intake/output data recorded.  FHR baseline 135 bpm, Variability: moderate, Accelerations:present, Decelerations:  Absent Toco: not tracing well, irregular right now   SVE:   Dilation: 1 Effacement (%): 50 Station: -3 Exam by:: [email or web address], CNM  Pitocin @ 2 mu/min  Labs: Lab Results  Component Value Date   WBC 9.0 04/20/2021   HGB 11.5 (L) 04/20/2021   HCT 34.5 (L) 04/20/2021   MCV 94.0 04/20/2021   PLT 195 04/20/2021    Assessment / Plan: foley still in (placed at 2300 last night Cut pit off, eat breakfast (discussed w/ Dr. 04/22/2021). Then restart pitocin per protocol, do not exceed 49mu/min while foley still in.   Labor: foley in  Fetal Wellbeing:  Category I Pain Control:  n/a Pre-eclampsia: N/A I/D:  GBS neg Anticipated MOD: VBAC  11m CNM, WHNP-BC 04/23/2021, 8:58 AM

## 2021-04-23 NOTE — Progress Notes (Signed)
Patient ID: Christine Ibarra, female   DOB: February 08, 1976, 45 y.o.   MRN: [id number] Christine Ibarra is a 45 y.o. G2P1001 at [redacted]w[redacted]d admitted for induction of labor due to advanced maternal age and n/a  Subjective: breathing with contractions, doing well  Objective: BP 102/64   Pulse (!) 117   Temp 97.8 F (36.6 C) (Oral)   Resp 18   Ht 5\' 2"  (1.575 m)   Wt 77.9 kg   LMP 07/07/2020 (Approximate)   SpO2 100%   BMI 31.42 kg/m  No intake/output data recorded.  FHR baseline 135 bpm, Variability: moderate, Accelerations:present, Decelerations:  Absent Toco: not tracing d/t abdominal binder (on to keep fetus vtx after ECV yesterday)   SVE:  4/50/ballotable, posterior, vtx Pitocin @ 14 mu/min Do not want to remove binder to better trance UCs externally d/t vtx still ballotable Recommended IUPC d/t TOLAC on pit, pt ok w/ this, placed IUPC w/o difficulty  Labs: Lab Results  Component Value Date   WBC 9.0 04/20/2021   HGB 11.5 (L) 04/20/2021   HCT 34.5 (L) 04/20/2021   MCV 94.0 04/20/2021   PLT 195 04/20/2021    Assessment / Plan: IOL d/t advanced maternal age, s/p foley bulb (out at 1000). Pitocin: currently at 14 mu/min. SROM Lt MSF @ 1330. IUPC placed d/t inability to trace UCs d/t abdominal binder.  Labor: early Fetal Wellbeing:  Category I Pain Control:  labor support without medications Pre-eclampsia: N/A I/D:  GBS neg Anticipated MOD: VBAC  04/22/2021 CNM, WHNP-BC 04/23/2021, 2:14 PM

## 2021-04-23 NOTE — Progress Notes (Signed)
Patient ID: Christine Ibarra, female   DOB: 1976/06/04, 45 y.o.   MRN: [id number] Christine Ibarra is a 45 y.o. G2P1001 at [redacted]w[redacted]d admitted for induction of labor due to advanced maternal age and n/a  Subjective: no complaints  Objective: BP (!) 93/50   Pulse 82   Temp (!) 97.3 F (36.3 C) (Axillary)   Resp 18   Ht 5\' 2"  (1.575 m)   Wt 77.9 kg   LMP 07/07/2020 (Approximate)   SpO2 100%   BMI 31.42 kg/m  No intake/output data recorded.  FHR baseline 135 bpm, Variability: moderate, Accelerations:present, Decelerations:  Absent Toco: not tracing, RN readjusted toco   SVE:   Dilation: 3 Effacement (%): 50 Station: Ballotable Exam by:: [email or web address], RN  FB out  Pitocin @ 2 mu/min  Labs: Lab Results  Component Value Date   WBC 9.0 04/20/2021   HGB 11.5 (L) 04/20/2021   HCT 34.5 (L) 04/20/2021   MCV 94.0 04/20/2021   PLT 195 04/20/2021    Assessment / Plan: IOL d/t advanced maternal age and n/a, s/p foley bulb. Pitocin: currently at 2 mu/min.  and Plan recheck cervix in 4 hours  Labor: s/p cervical ripening Fetal Wellbeing:  Category I Pain Control:  n/a Pre-eclampsia: N/A I/D:  GBS neg Anticipated MOD: VBAC  04/22/2021 CNM, WHNP-BC 04/23/2021, 10:12 AM

## 2021-04-23 NOTE — Progress Notes (Addendum)
Patient ID: Christine Ibarra, female   DOB: [birth year], 45 y.o.   MRN: [id number] Catrina Blucher is a 45 y.o. G2P1001 at [redacted]w[redacted]d admitted for induction of labor due to advanced maternal age  Subjective: comfortable with epidural and no complaints  Objective: BP 101/65   Pulse 88   Temp 98.5 F (36.9 C) (Oral)   Resp 18   Ht 5\' 2"  (1.575 m)   Wt 77.9 kg   LMP 07/07/2020 (Approximate)   SpO2 98%   BMI 31.42 kg/m  No intake/output data recorded.  FHR baseline 135 bpm, Variability: moderate, Accelerations:present, Decelerations:  Present early/mild variable Toco: regular, every 3 minutes  MVUs 160s  SVE:   Dilation: 4.5 Effacement (%): 50 Station: -3 Exam by:: [email or web address], CNM  Pitocin @ 20 mu/min   Labs: Lab Results  Component Value Date   WBC 9.0 04/20/2021   HGB 11.5 (L) 04/20/2021   HCT 34.5 (L) 04/20/2021   MCV 94.0 04/20/2021   PLT 195 04/20/2021    Assessment / Plan: IOL d/t AMA, s/p foley bulb, pit @ 20- MVUs nearing adequate, just got epidural and is comfortable, declines SVE right now. Pt ok w/ increasing pitocin as needed to achieve adequate MVUs now that she has epidural. Per Dr. 04/22/2021, ok to exceed 69mu/min.  Labor: early Fetal Wellbeing:  Category II Pain Control:  epidural Pre-eclampsia: N/A I/D:  GBS neg Anticipated MOD: VBAC  30m CNM, WHNP-BC 04/23/2021, 7:35 PM

## 2021-04-24 LAB — RPR: RPR Ser Ql: NONREACTIVE

## 2021-04-24 LAB — TYPE AND SCREEN
ABO/RH(D): O POS
Antibody Screen: NEGATIVE

## 2021-04-24 MED ORDER — ACETAMINOPHEN 500 MG PO TABS
1000.0000 mg | ORAL_TABLET | Freq: Once | ORAL | Status: AC
  Administered 2021-04-24: 1000 mg via ORAL
  Filled 2021-04-24: qty 2

## 2021-04-24 NOTE — Progress Notes (Signed)
Patient ID: Christine Ibarra, female   DOB: Apr 07, 1976, 45 y.o.   MRN: [id number] Doing well, comfortable with epidural  Vitals:   04/24/21 0130 04/24/21 0200 04/24/21 0215 04/24/21 0230  BP: (!) 85/51 (!) 85/48  (!) 103/57  Pulse: 91 84  89  Resp: 18 18  18   Temp:   98.5 F (36.9 C)   TempSrc:   Axillary   SpO2: 96% 94%  97%  Weight:      Height:       FHR reassuring UCs regular, 140-160 MVU/58min  Dilation: 5 Effacement (%): 70 Cervical Position: Middle Station: -2 Presentation: Vertex Exam by:: [email or web address], CNM  Updated Dr Wynelle Bourgeois. Plan to recheck at 0600 and determine plan of care at that time  Shawnie Pons, CNM

## 2021-04-24 NOTE — Progress Notes (Addendum)
Patient ID: Christine Ibarra, female   DOB: 1976/11/14, 45 y.o.   MRN: [id number] Christine Ibarra is a 45 y.o. G2P1001 at [redacted]w[redacted]d admitted for induction of labor due to advanced maternal age  Subjective: comfortable with epidural and no complaints  Objective: VSS: 98.6, 86, 18, 106/64 FHR baseline 130 bpm, Variability: moderate, Accelerations:present, Decelerations:  Absent Toco: rare   SVE:   Dilation: 5 Effacement (%): 80 Station: -2 Exam by:: Wynelle Bourgeois, CNM @ [id number]  Vtx confirmed by bs u/s  Labs: Lab Results  Component Value Date   WBC 9.0 04/20/2021   HGB 11.5 (L) 04/20/2021   HCT 34.5 (L) 04/20/2021   MCV 94.0 04/20/2021   PLT 195 04/20/2021    Assessment / Plan: IOL d/t AMA, s/p foley bulb, pit x 23hrs, now s/p 2hr pit break. Restart per protocol  Labor: early Fetal Wellbeing:  Category I Pain Control:  epidural Pre-eclampsia: N/A I/D:  GBS neg Anticipated MOD: NSVB  Cheral Marker CNM, WHNP-BC 04/24/2021, 1115

## 2021-04-24 NOTE — Progress Notes (Signed)
Patient ID: [name], female   DOB: [birth year], 45 y.o.   MRN: [id number] Daleysa Tello is a 45 y.o. G2P1001 at [redacted]w[redacted]d admitted for induction of labor due to advanced maternal age  Subjective: comfortable with epidural and no complaints  Objective: BP (!) 99/58   Pulse 93   Temp 98.4 F (36.9 C) (Oral)   Resp 18   Ht 5\' 2"  (1.575 m)   Wt 77.9 kg   LMP 07/07/2020 (Approximate)   SpO2 97%   BMI 31.42 kg/m  No intake/output data recorded.  FHR baseline 150 bpm, Variability: moderate, Accelerations:present, Decelerations:  Present: occasional earlies Toco: regular, every 3 minutes  MVUs inadequate  SVE:   Dilation: 6 Effacement (%): 90 Station: -1 Exam by:: [email or web address], CNM  Checked through contraction, vtx well applied against cx and has good force w/ uc  Pitocin @ 30 mu/min  Labs: Lab Results  Component Value Date   WBC 9.0 04/20/2021   HGB 11.5 (L) 04/20/2021   HCT 34.5 (L) 04/20/2021   MCV 94.0 04/20/2021   PLT 195 04/20/2021    Assessment / Plan: IOL d/t AMA, s/p foley bulb, SROM yesterday @ 1330 light MSF, pit x 23hrs then 2hr break this am 0900-1100, pit now @ 30 and cx is changing. Updated Dr. 04/22/2021  Labor: active Fetal Wellbeing:  Category I Pain Control:  epidural Pre-eclampsia: N/A I/D:  GBS neg Anticipated MOD: VBAC  Jolayne Panther CNM, WHNP-BC 04/24/2021, 7:37 PM

## 2021-04-24 NOTE — Progress Notes (Addendum)
Patient ID: Christine Ibarra, female   DOB: 1976-09-21, 45 y.o.   MRN: [id number] Christine Ibarra is a 45 y.o. G2P1001 at [redacted]w[redacted]d admitted for induction of labor due to advanced maternal age  Subjective: comfortable with epidural and no complaints  Objective: BP 116/75   Pulse 89   Temp 99.9 F (37.7 C) (Axillary)   Resp 16   Ht 5\' 2"  (1.575 m)   Wt 77.9 kg   LMP 07/07/2020 (Approximate)   SpO2 97%   BMI 31.42 kg/m  Total I/O In: -  Out: 175 [Urine:175]  FHR baseline 130 bpm, Variability: moderate, Accelerations:present, Decelerations:  Absent Toco: q 3-11mins MVUs inadequate  SVE:   Dilation: 5 Effacement (%): 80 Station: -2 Exam by:: [email or web address], CNM  Pitocin @ 30 mu/min  Labs: Lab Results  Component Value Date   WBC 9.0 04/20/2021   HGB 11.5 (L) 04/20/2021   HCT 34.5 (L) 04/20/2021   MCV 94.0 04/20/2021   PLT 195 04/20/2021    Assessment / Plan: IOL d/t advanced maternal age, s/p foley bulb, pit since 1000 yesterday, now at 43mu/min and MVUs have never been adequate. Per discussion w/ team, will give pit break x 2hrs then restart per protocol. Pt likes this plan. Having vulvar swelling, try ice packs/cool compresses.   Labor: early Fetal Wellbeing:  Category I Pain Control:  epidural Pre-eclampsia: N/A I/D:  GBS neg Anticipated MOD: VBAC  31m CNM, WHNP-BC 04/24/2021, 9:23 AM

## 2021-04-24 NOTE — Progress Notes (Addendum)
Patient ID: [name], female   DOB: [birth year], 45 y.o.   MRN: [id number] Christine Ibarra is a 45 y.o. G2P1001 at [redacted]w[redacted]d admitted for induction of labor due to advanced maternal age  Subjective: comfortable with epidural and no complaints  Objective: BP 116/62   Pulse 87   Temp 99.2 F (37.3 C) (Oral)   Resp 16   Ht 5\' 2"  (1.575 m)   Wt 77.9 kg   LMP 07/07/2020 (Approximate)   SpO2 97%   BMI 31.42 kg/m  Total I/O In: -  Out: 1925 [Urine:1925]  FHR baseline 135 bpm, Variability: moderate, Accelerations:present, Decelerations:  Absent Toco: regular, every 3 minutes  MVUs inadequate  SVE:   Dilation: 4.5 Effacement (%): 90 Station: -2 Exam by:: K Calvyn Kurtzman CNM  Checked through contraction as well, feel forceful, vtx comes down w/ uc  Pitocin @ 20 mu/min  Labs: Lab Results  Component Value Date   WBC 9.0 04/20/2021   HGB 11.5 (L) 04/20/2021   HCT 34.5 (L) 04/20/2021   MCV 94.0 04/20/2021   PLT 195 04/20/2021    Assessment / Plan: IOL d/t AMA, s/p foley bulb, SROM'd yesterday at 1330, was on pit x 13hrs- max of 30, s/p 2hr pit break from 0900-1100.  Has been back on pit x 6hrs now and is at 20.  This is my first exam today and definitely feels improved from my exam yesterday, cx thinner and midline (vs super posterior) and vtx lower/in pelvix now.  Discussed w/ Dr. 04/22/2021, continue to try to achieve adequate MVUs- can increase pit to max of 30.  Labor: early Fetal Wellbeing:  Category I Pain Control:  epidural Pre-eclampsia: N/A I/D:  GBS neg Anticipated MOD: VBAC  Jolayne Panther CNM, WHNP-BC 04/24/2021, 5:23 PM

## 2021-04-25 ENCOUNTER — Encounter (HOSPITAL_COMMUNITY)
Admission: RE | Disposition: A | Discharge status: Home / Self Care | Payer: Self-pay | Source: Home / Self Care | Attending: Family Medicine

## 2021-04-25 ENCOUNTER — Encounter (HOSPITAL_COMMUNITY): Payer: Self-pay | Admitting: Family Medicine

## 2021-04-25 SURGERY — Surgical Case
Anesthesia: Epidural | Wound class: Clean Contaminated

## 2021-04-25 MED ORDER — SODIUM CHLORIDE 0.9 % IR SOLN
Status: DC | PRN
  Administered 2021-04-25: 1000 mL

## 2021-04-25 MED ORDER — OXYTOCIN-SODIUM CHLORIDE 30-0.9 UT/500ML-% IV SOLN: 2.5000 [IU]/h | INTRAVENOUS | Status: AC

## 2021-04-25 MED ORDER — DIBUCAINE (PERIANAL) 1 % EX OINT: 1.0000 "application " | TOPICAL_OINTMENT | CUTANEOUS | Status: DC | PRN

## 2021-04-25 MED ORDER — DEXMEDETOMIDINE (PRECEDEX) IN NS 20 MCG/5ML (4 MCG/ML) IV SYRINGE
PREFILLED_SYRINGE | INTRAVENOUS | Status: AC
  Filled 2021-04-25: qty 5

## 2021-04-25 MED ORDER — NALBUPHINE HCL 10 MG/ML IJ SOLN: 5.0000 mg | INTRAMUSCULAR | Status: DC | PRN

## 2021-04-25 MED ORDER — NALOXONE HCL 0.4 MG/ML IJ SOLN: 0.4000 mg | INTRAMUSCULAR | Status: DC | PRN

## 2021-04-25 MED ORDER — WITCH HAZEL-GLYCERIN EX PADS: 1.0000 "application " | MEDICATED_PAD | CUTANEOUS | Status: DC | PRN

## 2021-04-25 MED ORDER — TRANEXAMIC ACID-NACL 1000-0.7 MG/100ML-% IV SOLN
INTRAVENOUS | Status: DC | PRN
  Administered 2021-04-25: 1000 mg via INTRAVENOUS

## 2021-04-25 MED ORDER — KETOROLAC TROMETHAMINE 30 MG/ML IJ SOLN
30.0000 mg | Freq: Four times a day (QID) | INTRAMUSCULAR | Status: AC
  Administered 2021-04-25: 30 mg via INTRAVENOUS
  Filled 2021-04-25: qty 1

## 2021-04-25 MED ORDER — SODIUM CHLORIDE 0.9 % IV SOLN
2.0000 g | Freq: Four times a day (QID) | INTRAVENOUS | Status: DC
  Administered 2021-04-25 (×2): 2 g via INTRAVENOUS
  Filled 2021-04-25 (×2): qty 2000

## 2021-04-25 MED ORDER — SODIUM CHLORIDE 0.9% FLUSH: 3.0000 mL | INTRAVENOUS | Status: DC | PRN

## 2021-04-25 MED ORDER — DIPHENHYDRAMINE HCL 25 MG PO CAPS
25.0000 mg | ORAL_CAPSULE | Freq: Four times a day (QID) | ORAL | Status: DC | PRN
Start: 2021-04-25 — End: 2021-04-28

## 2021-04-25 MED ORDER — SENNOSIDES-DOCUSATE SODIUM 8.6-50 MG PO TABS
2.0000 | ORAL_TABLET | ORAL | Status: DC
  Administered 2021-04-25 – 2021-04-27 (×3): 2 via ORAL
  Filled 2021-04-25 (×3): qty 2

## 2021-04-25 MED ORDER — OXYCODONE HCL 5 MG PO TABS: 5.0000 mg | ORAL_TABLET | Freq: Once | ORAL | Status: DC | PRN

## 2021-04-25 MED ORDER — DIPHENHYDRAMINE HCL 25 MG PO CAPS: 25.0000 mg | ORAL_CAPSULE | ORAL | Status: DC | PRN

## 2021-04-25 MED ORDER — ONDANSETRON HCL 4 MG/2ML IJ SOLN
INTRAMUSCULAR | Status: AC
  Filled 2021-04-25: qty 2

## 2021-04-25 MED ORDER — PROMETHAZINE HCL 25 MG/ML IJ SOLN: 6.2500 mg | INTRAMUSCULAR | Status: DC | PRN

## 2021-04-25 MED ORDER — SIMETHICONE 80 MG PO CHEW
80.0000 mg | CHEWABLE_TABLET | ORAL | Status: DC | PRN
Start: 2021-04-25 — End: 2021-04-28

## 2021-04-25 MED ORDER — OXYCODONE HCL 5 MG PO TABS
5.0000 mg | ORAL_TABLET | ORAL | Status: DC | PRN
  Administered 2021-04-26: 5 mg via ORAL
  Filled 2021-04-25: qty 1

## 2021-04-25 MED ORDER — DEXAMETHASONE SODIUM PHOSPHATE 4 MG/ML IJ SOLN
INTRAMUSCULAR | Status: DC | PRN
  Administered 2021-04-25: 5 mg via INTRAVENOUS

## 2021-04-25 MED ORDER — ZOLPIDEM TARTRATE 5 MG PO TABS: 5.0000 mg | ORAL_TABLET | Freq: Every evening | ORAL | Status: DC | PRN

## 2021-04-25 MED ORDER — LACTATED RINGERS IV SOLN: INTRAVENOUS | Status: DC

## 2021-04-25 MED ORDER — SODIUM CHLORIDE 0.9 % IV SOLN
INTRAVENOUS | Status: AC
  Filled 2021-04-25: qty 500

## 2021-04-25 MED ORDER — AZITHROMYCIN 500 MG IV SOLR
500.0000 mg | INTRAVENOUS | Status: AC
Start: 2021-04-25 — End: 2021-04-25
  Administered 2021-04-25: 500 mg via INTRAVENOUS

## 2021-04-25 MED ORDER — SCOPOLAMINE 1 MG/3DAYS TD PT72
MEDICATED_PATCH | TRANSDERMAL | Status: AC
  Filled 2021-04-25: qty 1

## 2021-04-25 MED ORDER — OXYCODONE HCL 5 MG/5ML PO SOLN: 5.0000 mg | Freq: Once | ORAL | Status: DC | PRN

## 2021-04-25 MED ORDER — NALBUPHINE HCL 10 MG/ML IJ SOLN: 5.0000 mg | Freq: Once | INTRAMUSCULAR | Status: DC | PRN

## 2021-04-25 MED ORDER — KETOROLAC TROMETHAMINE 30 MG/ML IJ SOLN: 30.0000 mg | Freq: Four times a day (QID) | INTRAMUSCULAR | Status: DC | PRN

## 2021-04-25 MED ORDER — STERILE WATER FOR IRRIGATION IR SOLN
Status: DC | PRN
  Administered 2021-04-25: 1000 mL

## 2021-04-25 MED ORDER — MENTHOL 3 MG MT LOZG: 1.0000 | LOZENGE | OROMUCOSAL | Status: DC | PRN

## 2021-04-25 MED ORDER — KETOROLAC TROMETHAMINE 30 MG/ML IJ SOLN
INTRAMUSCULAR | Status: AC
  Filled 2021-04-25: qty 1

## 2021-04-25 MED ORDER — DEXAMETHASONE SODIUM PHOSPHATE 4 MG/ML IJ SOLN: INTRAMUSCULAR | Status: DC | PRN

## 2021-04-25 MED ORDER — ENOXAPARIN SODIUM 40 MG/0.4ML IJ SOSY
40.0000 mg | PREFILLED_SYRINGE | INTRAMUSCULAR | Status: DC
  Administered 2021-04-25 – 2021-04-27 (×3): 40 mg via SUBCUTANEOUS
  Filled 2021-04-25 (×3): qty 0.4

## 2021-04-25 MED ORDER — MORPHINE SULFATE (PF) 0.5 MG/ML IJ SOLN
INTRAMUSCULAR | Status: DC | PRN
  Administered 2021-04-25: 3 mg via EPIDURAL

## 2021-04-25 MED ORDER — ACETAMINOPHEN 500 MG PO TABS: 1000.0000 mg | ORAL_TABLET | Freq: Four times a day (QID) | ORAL | Status: DC

## 2021-04-25 MED ORDER — GENTAMICIN SULFATE 40 MG/ML IJ SOLN
5.0000 mg/kg | INTRAVENOUS | Status: DC
  Administered 2021-04-25: 310 mg via INTRAVENOUS
  Filled 2021-04-25: qty 7.75

## 2021-04-25 MED ORDER — DIPHENHYDRAMINE HCL 50 MG/ML IJ SOLN: 12.5000 mg | INTRAMUSCULAR | Status: DC | PRN

## 2021-04-25 MED ORDER — ONDANSETRON HCL 4 MG/2ML IJ SOLN: 4.0000 mg | Freq: Three times a day (TID) | INTRAMUSCULAR | Status: DC | PRN

## 2021-04-25 MED ORDER — PRENATAL MULTIVITAMIN CH
1.0000 | ORAL_TABLET | Freq: Every day | ORAL | Status: DC
  Administered 2021-04-25 – 2021-04-28 (×4): 1 via ORAL
  Filled 2021-04-25 (×4): qty 1

## 2021-04-25 MED ORDER — CHLOROPROCAINE HCL (PF) 3 % IJ SOLN
INTRAMUSCULAR | Status: AC
  Filled 2021-04-25: qty 20

## 2021-04-25 MED ORDER — NALOXONE HCL 4 MG/10ML IJ SOLN: 1.0000 ug/kg/h | INTRAVENOUS | Status: DC | PRN

## 2021-04-25 MED ORDER — HYDROMORPHONE HCL 1 MG/ML IJ SOLN: 0.2500 mg | INTRAMUSCULAR | Status: DC | PRN

## 2021-04-25 MED ORDER — MEPERIDINE HCL 25 MG/ML IJ SOLN: 6.2500 mg | INTRAMUSCULAR | Status: DC | PRN

## 2021-04-25 MED ORDER — CLINDAMYCIN PHOSPHATE 900 MG/50ML IV SOLN
900.0000 mg | Freq: Three times a day (TID) | INTRAVENOUS | Status: DC
  Administered 2021-04-25: 900 mg via INTRAVENOUS
  Filled 2021-04-25 (×2): qty 50

## 2021-04-25 MED ORDER — IBUPROFEN 600 MG PO TABS
600.0000 mg | ORAL_TABLET | Freq: Four times a day (QID) | ORAL | Status: DC
  Administered 2021-04-25 – 2021-04-28 (×11): 600 mg via ORAL
  Filled 2021-04-25 (×11): qty 1

## 2021-04-25 MED ORDER — ONDANSETRON HCL 4 MG/2ML IJ SOLN
INTRAMUSCULAR | Status: DC | PRN
  Administered 2021-04-25: 4 mg via INTRAVENOUS

## 2021-04-25 MED ORDER — OXYTOCIN-SODIUM CHLORIDE 30-0.9 UT/500ML-% IV SOLN
INTRAVENOUS | Status: AC
  Filled 2021-04-25: qty 500

## 2021-04-25 MED ORDER — TETANUS-DIPHTH-ACELL PERTUSSIS 5-2.5-18.5 LF-MCG/0.5 IM SUSY
0.5000 mL | PREFILLED_SYRINGE | Freq: Once | INTRAMUSCULAR | Status: AC
Start: 2021-04-26 — End: 2021-04-26
  Administered 2021-04-26: 0.5 mL via INTRAMUSCULAR
  Filled 2021-04-25: qty 0.5

## 2021-04-25 MED ORDER — SCOPOLAMINE 1 MG/3DAYS TD PT72: 1.0000 | MEDICATED_PATCH | Freq: Once | TRANSDERMAL | Status: DC

## 2021-04-25 MED ORDER — FENTANYL CITRATE (PF) 100 MCG/2ML IJ SOLN
INTRAMUSCULAR | Status: AC
  Filled 2021-04-25: qty 2

## 2021-04-25 MED ORDER — ACETAMINOPHEN 500 MG PO TABS
1000.0000 mg | ORAL_TABLET | Freq: Four times a day (QID) | ORAL | Status: DC
  Administered 2021-04-25 – 2021-04-28 (×11): 1000 mg via ORAL
  Filled 2021-04-25 (×12): qty 2

## 2021-04-25 MED ORDER — FENTANYL CITRATE (PF) 100 MCG/2ML IJ SOLN
INTRAMUSCULAR | Status: DC | PRN
  Administered 2021-04-25: 100 ug via EPIDURAL

## 2021-04-25 MED ORDER — PHENYLEPHRINE HCL (PRESSORS) 10 MG/ML IV SOLN
INTRAVENOUS | Status: DC | PRN
  Administered 2021-04-25 (×3): 80 ug via INTRAVENOUS

## 2021-04-25 MED ORDER — SOD CITRATE-CITRIC ACID 500-334 MG/5ML PO SOLN: 30.0000 mL | ORAL | Status: DC

## 2021-04-25 MED ORDER — COCONUT OIL OIL: 1.0000 "application " | TOPICAL_OIL | Status: DC | PRN

## 2021-04-25 MED ORDER — DEXAMETHASONE SODIUM PHOSPHATE 10 MG/ML IJ SOLN
INTRAMUSCULAR | Status: AC
  Filled 2021-04-25: qty 1

## 2021-04-25 MED ORDER — TRANEXAMIC ACID-NACL 1000-0.7 MG/100ML-% IV SOLN: 1000.0000 mg | INTRAVENOUS | Status: DC

## 2021-04-25 MED ORDER — NALOXONE HCL 4 MG/10ML IJ SOLN
1.0000 ug/kg/h | INTRAVENOUS | Status: DC | PRN
  Filled 2021-04-25: qty 5

## 2021-04-25 MED ORDER — MORPHINE SULFATE (PF) 0.5 MG/ML IJ SOLN
INTRAMUSCULAR | Status: AC
  Filled 2021-04-25: qty 10

## 2021-04-25 MED ORDER — ACETAMINOPHEN 10 MG/ML IV SOLN
1000.0000 mg | Freq: Once | INTRAVENOUS | Status: DC | PRN
  Administered 2021-04-25: 1000 mg via INTRAVENOUS

## 2021-04-25 MED ORDER — MEASLES, MUMPS & RUBELLA VAC IJ SOLR: 0.5000 mL | Freq: Once | INTRAMUSCULAR | Status: DC

## 2021-04-25 MED ORDER — DEXMEDETOMIDINE (PRECEDEX) IN NS 20 MCG/5ML (4 MCG/ML) IV SYRINGE
PREFILLED_SYRINGE | INTRAVENOUS | Status: DC | PRN
  Administered 2021-04-25: 8 ug via INTRAVENOUS

## 2021-04-25 MED ORDER — SIMETHICONE 80 MG PO CHEW
80.0000 mg | CHEWABLE_TABLET | Freq: Three times a day (TID) | ORAL | Status: DC
  Administered 2021-04-25 – 2021-04-28 (×9): 80 mg via ORAL
  Filled 2021-04-25 (×9): qty 1

## 2021-04-25 MED ORDER — ACETAMINOPHEN 10 MG/ML IV SOLN
INTRAVENOUS | Status: AC
  Filled 2021-04-25: qty 100

## 2021-04-25 MED ORDER — SCOPOLAMINE 1 MG/3DAYS TD PT72
1.0000 | MEDICATED_PATCH | Freq: Once | TRANSDERMAL | Status: AC
  Administered 2021-04-25: 1.5 mg via TRANSDERMAL

## 2021-04-25 MED ORDER — GABAPENTIN 100 MG PO CAPS
100.0000 mg | ORAL_CAPSULE | Freq: Three times a day (TID) | ORAL | Status: DC
  Administered 2021-04-25 – 2021-04-28 (×9): 100 mg via ORAL
  Filled 2021-04-25 (×9): qty 1

## 2021-04-25 MED ORDER — OXYTOCIN-SODIUM CHLORIDE 30-0.9 UT/500ML-% IV SOLN
INTRAVENOUS | Status: DC | PRN
  Administered 2021-04-25 (×2): 150 mL via INTRAVENOUS

## 2021-04-25 MED ORDER — KETOROLAC TROMETHAMINE 30 MG/ML IJ SOLN
30.0000 mg | Freq: Once | INTRAMUSCULAR | Status: AC | PRN
  Administered 2021-04-25: 30 mg via INTRAVENOUS

## 2021-04-25 MED ORDER — TRANEXAMIC ACID-NACL 1000-0.7 MG/100ML-% IV SOLN
INTRAVENOUS | Status: AC
  Filled 2021-04-25: qty 100

## 2021-04-25 SURGICAL SUPPLY — 30 items
BENZOIN TINCTURE PRP APPL 2/3 (GAUZE/BANDAGES/DRESSINGS) ×2 IMPLANT
CHLORAPREP W/TINT 26ML (MISCELLANEOUS) ×2 IMPLANT
CLAMP CORD UMBIL (MISCELLANEOUS) IMPLANT
DECANTER SPIKE VIAL GLASS SM (MISCELLANEOUS) ×2 IMPLANT
DRSG OPSITE POSTOP 4X10 (GAUZE/BANDAGES/DRESSINGS) ×4 IMPLANT
ELECT REM PT RETURN 9FT ADLT (ELECTROSURGICAL) ×2
ELECTRODE REM PT RTRN 9FT ADLT (ELECTROSURGICAL) ×1 IMPLANT
EXTRACTOR VACUUM M CUP 4 TUBE (SUCTIONS) IMPLANT
GLOVE BIOGEL PI IND STRL 6.5 (GLOVE) ×1 IMPLANT
GLOVE BIOGEL PI IND STRL 7.0 (GLOVE) ×1 IMPLANT
GLOVE BIOGEL PI INDICATOR 6.5 (GLOVE) ×1
GLOVE BIOGEL PI INDICATOR 7.0 (GLOVE) ×1
GLOVE SURG SS PI 6.5 STRL IVOR (GLOVE) ×2 IMPLANT
GOWN STRL REUS W/TWL LRG LVL3 (GOWN DISPOSABLE) ×4 IMPLANT
KIT ABG SYR 3ML LUER SLIP (SYRINGE) IMPLANT
NEEDLE HYPO 25X5/8 SAFETYGLIDE (NEEDLE) IMPLANT
NS IRRIG 1000ML POUR BTL (IV SOLUTION) ×2 IMPLANT
PACK C SECTION WH (CUSTOM PROCEDURE TRAY) ×2 IMPLANT
PAD OB MATERNITY 4.3X12.25 (PERSONAL CARE ITEMS) ×2 IMPLANT
PENCIL SMOKE EVAC W/HOLSTER (ELECTROSURGICAL) ×2 IMPLANT
RTRCTR C-SECT PINK 25CM LRG (MISCELLANEOUS) IMPLANT
STRIP CLOSURE SKIN 1/2X4 (GAUZE/BANDAGES/DRESSINGS) ×2 IMPLANT
SUT PLAIN 0 NONE (SUTURE) IMPLANT
SUT VIC AB 0 CT1 36 (SUTURE) ×8 IMPLANT
SUT VIC AB 0 CTX 36 (SUTURE) ×1
SUT VIC AB 0 CTX36XBRD ANBCTRL (SUTURE) ×1 IMPLANT
SUT VIC AB 4-0 KS 27 (SUTURE) ×2 IMPLANT
TOWEL OR 17X24 6PK STRL BLUE (TOWEL DISPOSABLE) ×2 IMPLANT
TRAY FOLEY W/BAG SLVR 14FR LF (SET/KITS/TRAYS/PACK) ×2 IMPLANT
WATER STERILE IRR 1000ML POUR (IV SOLUTION) ×2 IMPLANT

## 2021-04-25 NOTE — Lactation Note (Signed)
This note was copied from a baby's chart. Lactation Consultation Note  Patient Name: Christine Ibarra [id number]'R Date: 04/25/2021 Reason for consult: Initial assessment;Term Age:43 hours Mom resting in bed, baby asleep on back in bassinet. Mom reports h/o breastfeeding x46mo (child now 3.5yo), mom reports supplementing with formula throughout the night d/t thoughts of baby not getting enough. Mom states this baby is not interested in nursing, reports last attempted to latch at ~2pm but unsuccessful, states baby spit up amniotic fluid at that time.  LC taught hand expression, mom demonstrated and noted 1 drop from left breast. Baby up to left breast skin to skin cradle position, mom agreed to switch to football hold. Mom latched baby with assistance, multiple audible swallows and breast tissue movement noted.   Discussed cue based feedings, expect 8-12 feeds a day, wake if >3-4hrs since last feed, continuous skin to skin while awake and alert, signs of adequate milk transfer, avoid pacifiers/bottles/pumping x34mo unless indicated. Mom voiced understanding and with no further concerns. Left the room with baby still latched ~42min mark. Reviewed above with RN.   Called back to room per mom's request. Mom states baby came off of breast ~63min mark, requests help with positioning on the right breast. LC assisted mom position self in bed, baby burped on own, mom latched baby without assistance. Reinforced signs of normal newborn behavior in first 24hrs, discussed what to expect with cluster feeding day 2, encouraged to delay bath to allow more practice with BF. Mom voiced understanding and with no further concerns. Left the room with baby still latched ~61min mark. BGilliam, RN, IBCLC  Maternal Data Has patient been taught Hand Expression?: Yes Does the patient have breastfeeding experience prior to this delivery?: Yes How long did the patient breastfeed?: 33mo (child now 3.5yo)  Feeding Mother's  Current Feeding Choice: Breast Milk  LATCH Score Latch: Grasps breast easily, tongue down, lips flanged, rhythmical sucking.  Audible Swallowing: Spontaneous and intermittent  Type of Nipple: Everted at rest and after stimulation  Comfort (Breast/Nipple): Soft / non-tender  Hold (Positioning): Assistance needed to correctly position infant at breast and maintain latch.  LATCH Score: 9   Lactation Tools Discussed/Used    Interventions Interventions: Breast feeding basics reviewed;Assisted with latch;Skin to skin;Hand express;Breast compression;Adjust position;Support pillows;Position options  Discharge Pump: Personal (ordered but not yet arrived)  Consult Status Consult Status: Follow-up Date: 04/26/21 Follow-up type: In-patient    Charlynn Court 04/25/2021, 9:50 PM

## 2021-04-25 NOTE — Progress Notes (Signed)
Patient ID: Christine Ibarra, female   DOB: 05-Aug-1976, 45 y.o.   MRN: [id number] Christine Ibarra is a 45 y.o. G2P1001 at [redacted]w[redacted]d admitted for induction of labor due to advanced maternal age. S/p successful ECV under epidural on 5/28. H/o Cesarean x1 for failure to progress.  Subjective: Pt reports resting comfortably with epidural in place. Now endorsing "feeling hot over abdomen".  Objective: BP 109/61   Pulse 99   Temp (!) 100.5 F (38.1 C) (Axillary)   Resp 18   Ht 5\' 2"  (1.575 m)   Wt 77.9 kg   LMP 07/07/2020 (Approximate)   SpO2 97%   BMI 31.42 kg/m  Total I/O In: 460 [P.O.:460] Out: 775 [Urine:775]  FHR baseline 130 bpm, Variability: moderate, Accelerations:present, Decelerations: none Toco: regular, every 3-4 minutes; 70 MVUs  SVE:   Dilation: 7 Effacement (%): 90 Station: 0 Exam by:: Semaje Kinker   Pitocin @ 30 mu/min since 1915  Labs: Lab Results  Component Value Date   WBC 9.0 04/20/2021   HGB 11.5 (L) 04/20/2021   HCT 34.5 (L) 04/20/2021   MCV 94.0 04/20/2021   PLT 195 04/20/2021    Assessment / Plan: Christine Ibarra is a 44yo G2P1001 at [redacted]w[redacted]d who presented on 04/22/21. S/p shared decision making with Dr. 04/24/21, pt elected for ECV under epidural. ECV was successful and pt was transferred to L&D for IOL given AMA and post-dates.   #TOLAC  IOL  Chorioamnionitis: Consented on risks of TOLAC and consented for repeat Cesarean on admission. S/p FB. Pitocin initially started at at 0645 on 5/29. S/p 2hr pit break on 5/30 from 0900-1100. Pitocin continued at 30 milli-units/min since 1915; continues to have inadequate contractions based on MVUs. Progressing slowly and now with chorioamnionitis based on maternal fever >100.29F x2 (max 101.63F). Amp+gent ordered and discussed with patient. Will plan to recheck cervical exam in 2-3 hours s/p shared decision making with patient. Labor: active Fetal Wellbeing:  Category I Pain Control:  epidural Pre-eclampsia: N/A I/D:  GBS neg;  amp+gent now ordered given new diagnosis of chorioamnionitis  Mathew Postiglione, 1916, MD OB Fellow, Faculty Practice 04/25/2021 12:47 AM

## 2021-04-25 NOTE — Progress Notes (Signed)
Patient ID: Christine Ibarra, female   DOB: 23-May-1976, 45 y.o.   MRN: [id number] Christine Ibarra is a 45 y.o. G2P1001 at [redacted]w[redacted]d admitted for induction of labor due to advanced maternal age. S/p successful ECV under epidural on 5/28. H/o Cesarean x1 for failure to progress.  Subjective: Pt resting comfortably with epidural in place. S/p unchanged cervical exam as noted below, pt understands recommendation for Cesarean if unchanged at next exam in several hours. Pt's doula at bedside.  Objective: BP 103/62   Pulse 77   Temp 98.1 F (36.7 C) (Axillary)   Resp 16   Ht 5\' 2"  (1.575 m)   Wt 77.9 kg   LMP 07/07/2020 (Approximate)   SpO2 97%   BMI 31.42 kg/m  Total I/O In: 460 [P.O.:460] Out: 775 [Urine:775]  FHR baseline 120 bpm, Variability: moderate, Accelerations:present, Decelerations: none Toco: regular, every 3-4 minutes; 40 MVUs  SVE:   Dilation: 7 Effacement (%): 90 Station: 0 Exam by:: Katlyn Muldrew   Pitocin @ 30 mu/min since 1915  Labs: Lab Results  Component Value Date   WBC 9.0 04/20/2021   HGB 11.5 (L) 04/20/2021   HCT 34.5 (L) 04/20/2021   MCV 94.0 04/20/2021   PLT 195 04/20/2021    Assessment / Plan: Ms. Storrs is a 45yo G2P1001 at [redacted]w[redacted]d who presented on 04/22/21. S/p shared decision making with Dr. 04/24/21, pt elected for ECV under epidural. ECV was successful and pt was transferred to L&D for IOL given AMA and post-dates.   #TOLAC  IOL  Chorioamnionitis: Consented on risks of TOLAC and consented for repeat Cesarean on admission. S/p FB. Pitocin initially started at at 0645 on 5/29. S/p 2hr pit break on 5/30 from 0900-1100. Pitocin continued at 30 milli-units/min since 1915; continues to have inadequate contractions based on MVUs. Amp+gent started shortly after midnight given chorioamnionitis based on maternal fever >100.36F x2 (max 101.22F). Given lack of cervical change since last exam, discussed recommendation for Cesarean if unchanged at next exam in several  hours. Dr. 1916 notified. Discussed plan extensively with patient, who feels comfortable with the plan.  Labor: active Fetal Wellbeing:  Category I Pain Control:  epidural Pre-eclampsia: N/A I/D:  GBS neg; amp+gent given diagnosis of chorioamnionitis  Christine Ibarra, Christine Panther, MD OB Fellow, Faculty Practice 04/25/2021 3:24 AM

## 2021-04-25 NOTE — Discharge Summary (Signed)
Postpartum Discharge Summary     Patient Name: Christine Ibarra DOB: [birth year] MRN: [id number]  Date of admission: 04/22/2021 Delivery date:04/25/2021  Delivering provider: Truett Mainland  Date of discharge: 04/27/2021  Admitting diagnosis: Previous cesarean delivery, antepartum [O34.219] AMA (advanced maternal age) multigravida 35+ [O09.529] Intrauterine pregnancy: [redacted]w[redacted]d     Secondary diagnosis:  Active Problems:   Late prenatal care affecting pregnancy in second trimester   Excessive weight gain during pregnancy in second trimester   AMA (advanced maternal age) multigravida 35+   Previous cesarean delivery, antepartum  Additional problems: none    Discharge diagnosis: Term Pregnancy Delivered                                              Post partum procedures:none Augmentation: Pitocin and IP Foley Complications: Intrauterine Inflammation or infection (Chorioamniotis)  Hospital course: Induction of Labor With Cesarean Section   45 y.o. yo G2P1001 at [redacted]w[redacted]d was admitted to the hospital 04/22/2021 for induction of labor. Patient had a labor course significant for chorio--given Amp and [place]. The patient went for cesarean section due to Arrest of Dilation. Delivery details are as follows: Membrane Rupture Time/Date: 1:30 PM ,04/23/2021   Delivery Method:C-Section, Low Transverse  Details of operation can be found in separate operative Note.  Patient had an uncomplicated postpartum course. She is ambulating, tolerating a regular diet, passing flatus, and urinating well.  Patient is discharged home in stable condition on 04/27/21.      Newborn Data: Birth date:04/25/2021  Birth time:9:19 AM  Gender:Female  Living status:Living  Apgars:9 ,9  Weight:3980 g                                  Magnesium Sulfate received: No BMZ received: No Rhophylac:N/A MMR:N/A T-DaP:declined prenatally    Flu: No Transfusion:No  Physical exam  Vitals:   04/26/21 0521 04/26/21 1225 04/26/21  2318 04/27/21 0506  BP: 101/68 92/67 119/89 103/74  Pulse: 73 77 85 72  Resp:  $Remo'18 18 17  'umJDw$ Temp:  97.6 F (36.4 C) 97.7 F (36.5 C) 97.7 F (36.5 C)  TempSrc:  Oral Oral Oral  SpO2:    100%  Weight:      Height:       General: alert, cooperative and no distress Lochia: appropriate Uterine Fundus: firm Incision: Healing well with no significant drainage DVT Evaluation: No evidence of DVT seen on physical exam. Labs: Lab Results  Component Value Date   WBC 16.9 (H) 04/26/2021   HGB 9.3 (L) 04/26/2021   HCT 27.5 (L) 04/26/2021   MCV 93.9 04/26/2021   PLT 165 04/26/2021   CMP Latest Ref Rng & Units 04/26/2021  Creatinine 0.44 - 1.00 mg/dL 0.77   Edinburgh Score: Edinburgh Postnatal Depression Scale Screening Tool 04/26/2021  I have been able to laugh and see the funny side of things. 0  I have looked forward with enjoyment to things. 0  I have blamed myself unnecessarily when things went wrong. 1  I have been anxious or worried for no good reason. 1  I have felt scared or panicky for no good reason. 2  Things have been getting on top of me. 0  I have been so unhappy that I have had difficulty sleeping. 0  I have felt  sad or miserable. 0  I have been so unhappy that I have been crying. 1  The thought of harming myself has occurred to me. 0  Edinburgh Postnatal Depression Scale Total 5     After visit meds:  Allergies as of 04/27/2021   No Known Allergies     Medication List    TAKE these medications   acetaminophen 500 MG tablet Commonly known as: TYLENOL Take 2 tablets (1,000 mg total) by mouth every 6 (six) hours.   coconut oil Oil Apply 1 application topically as needed.   ferrous sulfate 324 (65 Fe) MG Tbec Take 1 tablet (325 mg total) by mouth every other day.   ibuprofen 600 MG tablet Commonly known as: ADVIL Take 1 tablet (600 mg total) by mouth every 6 (six) hours.   multivitamin-prenatal 27-0.8 MG Tabs tablet Take 1 tablet by mouth in the morning.    oxyCODONE 5 MG immediate release tablet Commonly known as: Oxy IR/ROXICODONE Take 1-2 tablets (5-10 mg total) by mouth every 4 (four) hours as needed for moderate pain.   polyethylene glycol 17 g packet Commonly known as: MIRALAX / GLYCOLAX Take 17 g by mouth daily.            Discharge Care Instructions  (From admission, onward)         Start     Ordered   04/27/21 0000  Discharge wound care:       Comments: Remove dressing 7 days post-operatively.   04/27/21 0644           Discharge home in stable condition Infant Feeding: Breast Infant Disposition:home with mother Discharge instruction: per After Visit Summary and Postpartum booklet. Activity: Advance as tolerated. Pelvic rest for 6 weeks.  Diet: routine diet Future Appointments:No future appointments. Follow up Visit:   Please schedule this patient for a In person postpartum visit in 6 weeks with the following provider: Any provider. Additional Postpartum F/U:Incision check 1 week  High risk pregnancy complicated by: low lying placenta (now resolved), h/o CS for arrest of dilation, fetal PACs, AMA Delivery mode:  C-Section, Low Transverse  Anticipated Birth Control:  IUD   04/27/2021 Janet Berlin, MD

## 2021-04-25 NOTE — Anesthesia Postprocedure Evaluation (Signed)
Anesthesia Post Note  Patient: Betsi Austad  Procedure(s) Performed: CESAREAN SECTION (N/A )     Patient location during evaluation: PACU Anesthesia Type: Epidural Level of consciousness: oriented and awake and alert Pain management: pain level controlled Vital Signs Assessment: post-procedure vital signs reviewed and stable Respiratory status: spontaneous breathing, respiratory function stable and patient connected to nasal cannula oxygen Cardiovascular status: blood pressure returned to baseline and stable Postop Assessment: no headache, no backache and no apparent nausea or vomiting Anesthetic complications: no   No complications documented.  Last Vitals:  Vitals:   04/25/21 0732 04/25/21 0800  BP: 96/64 98/63  Pulse: 89 88  Resp: 16 17  Temp:    SpO2:      Last Pain:  Vitals:   04/25/21 0800  TempSrc:   PainSc: 0-No pain                 Mallery Harshman

## 2021-04-25 NOTE — Progress Notes (Signed)
Patient ID: [name], female   DOB: 28-Apr-1976, 45 y.o.   MRN: [id number] Devynn Pollio is a 45 y.o. G2P1001 at [redacted]w[redacted]d admitted for induction of labor due to advanced maternal age. S/p successful ECV under epidural on 5/28. H/o Cesarean x1 for failure to progress.  Subjective: Pt resting comfortably with epidural in place. Given unchanged cervical exam for nearly 6 hours despite max pitocin, discussed recommendation for repeat Cesarean delivery. Pt endorses understanding and agrees with plan as noted below.  Objective: BP 123/72   Pulse (!) 103   Temp 98.1 F (36.7 C) (Axillary)   Resp 17   Ht 5\' 2"  (1.575 m)   Wt 77.9 kg   LMP 07/07/2020 (Approximate)   SpO2 97%   BMI 31.42 kg/m  Total I/O In: 460 [P.O.:460] Out: 775 [Urine:775]  FHR baseline 125 bpm, Variability: moderate, Accelerations:present, Decelerations: none Toco: regular, every 4 minutes; 30 MVUs  SVE:   Dilation: 7 Effacement (%): 90 Station: 0 Exam by:: Keegen Heffern   Pitocin @ 30 mu/min since 1915; now discontinued  Labs: Lab Results  Component Value Date   WBC 9.0 04/20/2021   HGB 11.5 (L) 04/20/2021   HCT 34.5 (L) 04/20/2021   MCV 94.0 04/20/2021   PLT 195 04/20/2021    Assessment / Plan: Ms. Edell is a 44yo G2P1001 at [redacted]w[redacted]d who presented on 04/22/21. S/p shared decision making with Dr. 04/24/21 on admission, pt elected for ECV under epidural. ECV was successful and pt was transferred to L&D for IOL given AMA and post-dates.   #TOLAC  IOL  Chorioamnionitis  Arrest of Dilation: Consented on risks of TOLAC and consented for repeat Cesarean on admission. S/p FB. Pitocin initially started at at 0645 on 5/29. S/p 2hr pit break on 5/30 from 0900-1100. Pitocin continued at 30 milli-units/min since 1915; continues to have inadequate contractions based on MVUs. Amp+gent started shortly after midnight on 5/31 given chorioamnionitis based on maternal fever >100.77F x2 (max 101.42F). Given lack of cervical change  for nearly 6 hours despite max pitocin, discussed recommendation for repeat Cesarean. Dr. 6/31 notified; will discontinue pitocin and prep for OR at this time. Discussed plan extensively with patient, who feels comfortable with the plan.  Labor: active Fetal Wellbeing:  Category I strip Pain Control:  epidural Pre-eclampsia: N/A I/D:  GBS neg; s/p amp+gent given diagnosis of chorioamnionitis. Added clinda+azithro given plan for OR at this time.  Jolayne Panther, MD OB Fellow, Faculty Practice 04/25/2021 5:20 AM

## 2021-04-25 NOTE — Transfer of Care (Signed)
Immediate Anesthesia Transfer of Care Note  Patient: Christine Ibarra  Procedure(s) Performed: CESAREAN SECTION (N/A )  Patient Location: PACU  Anesthesia Type:Epidural  Level of Consciousness: awake, alert  and oriented  Airway & Oxygen Therapy: Patient Spontanous Breathing  Post-op Assessment: Report given to RN and Post -op Vital signs reviewed and stable  Post vital signs: Reviewed and stable  Last Vitals:  Vitals Value Taken Time  BP 102/74 04/25/21 1122  Temp 36.8 C 04/25/21 1122  Pulse 85 04/25/21 1122  Resp 18 04/25/21 1122  SpO2 99 % 04/25/21 1122    Last Pain:  Vitals:   04/25/21 1122  TempSrc: Oral  PainSc: 4       Patients Stated Pain Goal: 5 (04/24/21 0735)  Complications: No complications documented.

## 2021-04-25 NOTE — Op Note (Addendum)
Christine Ibarra PROCEDURE DATE: 04/25/2021  PREOPERATIVE DIAGNOSIS: Intrauterine pregnancy at  [redacted]w[redacted]d weeks gestation; failure to progress: arrest of dilation, failed trial of labor, intrauterine infection  POSTOPERATIVE DIAGNOSIS: The same  PROCEDURE: Repeat Low Transverse Cesarean Section  SURGEON:  Dr. Candelaria Celeste  ASSISTANT: Dr Elita Quick  INDICATIONS: Christine Ibarra is a 45 y.o. G2P1001 at [redacted]w[redacted]d scheduled for cesarean section secondary to chorioamnionitis and failure to progress: arrest of dilation.  The risks of cesarean section discussed with the patient included but were not limited to: bleeding which may require transfusion or reoperation; infection which may require antibiotics; injury to bowel, bladder, ureters or other surrounding organs; injury to the fetus; need for additional procedures including hysterectomy in the event of a life-threatening hemorrhage; placental abnormalities wth subsequent pregnancies, incisional problems, thromboembolic phenomenon and other postoperative/anesthesia complications. The patient concurred with the proposed plan, giving informed written consent for the procedure.    FINDINGS:  Viable femal infant in vertex presentation.  Apgars 9 and 9, weight, 8 pounds and 12 ounces.  Light meconium stained amniotic fluid.  Intact placenta, three vessel cord.  Normal uterus, fallopian tubes and ovaries bilaterally.  ANESTHESIA:    Spinal INTRAVENOUS FLUIDS: 1800 ml QUANTITATIVE BLOOD LOSS: 381 ml URINE OUTPUT:  550 ml SPECIMENS: Placenta sent to L&D COMPLICATIONS: None immediate  PROCEDURE IN DETAIL:  The patient received intravenous antibiotics and had sequential compression devices applied to her lower extremities while in the preoperative area.  She was then taken to the operating room where epidural anesthesia was dosed up to surgical level and was found to be adequate. She was then placed in a dorsal supine position with a leftward tilt, and  prepped and draped in a sterile manner.  A foley catheter was placed into her bladder and attached to constant gravity, which drained clear fluid throughout.  After an adequate timeout was performed, a Pfannenstiel skin incision was made with scalpel and carried through to the underlying layer of fascia. The fascia was incised in the midline and this incision was extended bilaterally using the Mayo scissors. Kocher clamps were applied to the superior aspect of the fascial incision and the underlying rectus muscles were dissected off bluntly. A similar process was carried out on the inferior aspect of the facial incision. The rectus muscles were separated in the midline bluntly and the peritoneum was entered bluntly. Upon entry of the peritoneum, a distended bladder was found. An Alexis retractor was placed to aid in visualization of the uterus. A bladder blade was also placed. Attention was turned to the lower uterine segment where a transverse hysterotomy was made with a scalpel and extended bilaterally bluntly. The infant was successfully delivered, and cord was clamped and cut and infant was handed over to awaiting neonatology team.   Uterine massage was then administered and the placenta delivered intact with three-vessel cord. The uterus was then cleared of clot and debris.  The hysterotomy was closed with 0 Vicryl in a running locked fashion, and an imbricating layer was also placed with a 0 Vicryl. Overall, excellent hemostasis was noted. The abdomen and the pelvis were cleared of all clot and debris and the Jon Gills was removed. Hemostasis was confirmed on all surfaces.  The peritoneum was reapproximated using 2-0 vicryl running stitches. The fascia was then closed using 0 Vicryl in a running fashion. The subcutaneous layer was reapproximated with plain gut and the skin was closed with 4-0 vicryl. The patient tolerated the procedure well. Sponge, lap, instrument and  needle counts were correct x 2. She was  taken to the recovery room in stable condition.    Christine Heritage, DO 04/25/2021 10:19 AM

## 2021-04-26 DIAGNOSIS — O34211 Maternal care for low transverse scar from previous cesarean delivery: Secondary | ICD-10-CM

## 2021-04-26 DIAGNOSIS — O480 Post-term pregnancy: Secondary | ICD-10-CM

## 2021-04-26 DIAGNOSIS — Z3A40 40 weeks gestation of pregnancy: Secondary | ICD-10-CM

## 2021-04-26 DIAGNOSIS — O411230 Chorioamnionitis, third trimester, not applicable or unspecified: Secondary | ICD-10-CM

## 2021-04-26 LAB — CREATININE, SERUM
Creatinine, Ser: 0.77 mg/dL (ref 0.44–1.00)
GFR, Estimated: >60 mL/min (ref >60)

## 2021-04-26 LAB — CBC
HCT: 27.5 % — ABNORMAL LOW (ref 36.0–46.0)
Hemoglobin: 9.3 g/dL — ABNORMAL LOW (ref 12.0–15.0)
MCH: 31.7 pg (ref 26.0–34.0)
MCHC: 33.8 g/dL (ref 30.0–36.0)
MCV: 93.9 fL (ref 80.0–100.0)
Platelets: 165 10*3/uL (ref 150–400)
RBC: 2.93 MIL/uL — ABNORMAL LOW (ref 3.87–5.11)
RDW: 14.9 % (ref 11.5–15.5)
WBC: 16.9 10*3/uL — ABNORMAL HIGH (ref 4.0–10.5)
nRBC: 0 % (ref 0.0–0.2)

## 2021-04-26 MED ORDER — FERROUS SULFATE 325 (65 FE) MG PO TABS
325.0000 mg | ORAL_TABLET | ORAL | Status: DC
  Administered 2021-04-26 – 2021-04-28 (×2): 325 mg via ORAL
  Filled 2021-04-26 (×2): qty 1

## 2021-04-26 MED ORDER — POLYETHYLENE GLYCOL 3350 17 G PO PACK
17.0000 g | PACK | Freq: Every day | ORAL | Status: DC
  Administered 2021-04-27: 17 g via ORAL
  Filled 2021-04-26 (×3): qty 1

## 2021-04-26 MED ORDER — FUROSEMIDE 40 MG PO TABS
40.0000 mg | ORAL_TABLET | Freq: Once | ORAL | Status: AC
  Administered 2021-04-26: 40 mg via ORAL
  Filled 2021-04-26: qty 1

## 2021-04-26 NOTE — Progress Notes (Signed)
POSTPARTUM PROGRESS NOTE  Subjective: Christine Ibarra is a 45 y.o. [id number] s/p rLTCS at [redacted]w[redacted]d.  She reports she doing well. No acute events overnight. She denies any problems with ambulating or po intake. Denies nausea or vomiting. She has not passed flatus. Pain is well controlled.  Lochia is minimal.  Objective: Blood pressure 92/63, pulse 68, temperature 97.6 F (36.4 C), temperature source Oral, resp. rate 18, height 5\' 2"  (1.575 m), weight 77.9 kg, last menstrual period 07/07/2020, SpO2 98 %, unknown if currently breastfeeding.  Physical Exam:  General: alert, cooperative and no distress Chest: no respiratory distress Abdomen: soft, non-tender  Uterine Fundus: firm and at level of umbilicus Extremities: No calf swelling or tenderness  no LE edema  No results for input(s): HGB, HCT in the last 72 hours.  Assessment/Plan: Christine Ibarra is a 45 y.o. G2P2002 s/p rLTCS at [redacted]w[redacted]d.   Routine Postpartum Care: Doing well, pain well-controlled.  -- Continue routine care, lactation support  -- Contraception: plan for outpatient Paraguard -- Feeding:breast -- Triple I: s/p amp/gent/clinda. Afebrile s/p delivery. Appropriate uterine tenderness on exam.  Dispo: Plan for discharge POD#2-3.  [redacted]w[redacted]d, MD OB Fellow, Faculty Practice 04/26/2021 4:15 AM

## 2021-04-26 NOTE — Progress Notes (Deleted)
Post Partum Day 1 Subjective: Christine Ibarra is a 45 y.o. G2P2002 s/p rLTCS at 40 weeks 5 days. Patient notes that she is doing well this morning. She denies any new events overnight. She reports that she has been able to tolerate PO with no nausea or vomiting. Additionally she denies any new episodes of nausea or vomiting last night or this morning. She notes having flatus a few minutes prior to our encounter this morning. Patient notes that her dizziness has significantly improved since yesterday and notes that she is able to ambulate this morning without feeling dizzy or unsteadiness in her gait. Patient endorses that she still feels some bilateral lower extremity swelling, but denies any color change or warmth of either calf. Patient notes amount of lochia has reduced since yesterday. She felt encouraged after her discussion with her lactation consult yesterday.   Objective: Blood pressure 101/68, pulse 73, temperature 97.6 F (36.4 C), temperature source Oral, resp. rate 18, height 5\' 2"  (1.575 m), weight 77.9 kg, last menstrual period 07/07/2020, SpO2 98 %, unknown if currently breastfeeding.  Physical Exam:  General: alert, appears stated age and no distress Lochia: appropriate Abdomen: soft, non-tender Uterine Fundus: firm, with appropriate tenderness.  DVT Evaluation: 1+ calf/ankle edema bilaterally. No warmth or color change of bilateral lower extremities.   Recent Labs    04/26/21 0516  HGB 9.3*  HCT 27.5*    Assessment/Plan: Christine Ibarra is a 45 y.o G2P2002 s/p rLTCS at 40 weeks 5 days.   Post-Partum Care:  - Clinically she is doing well and her pain is under good control.  - Review of her hemoglobin this morning is 9.3 g/dL. She will continue on PO iron supplementation.  - She will continue with routine support and lactation support. - Contraception plan: Due to patient's chorioamnionitis will plan to on outpatient ParaGard.  - Feeding plan: Patient plans to  breast feed.  - Patient has received triple antibiotic therapy with ampicillin, gentamicin, and clindamycin. Patient was afebrile post delivery and remains afebrile.  -At present she continues to have appropriate uterine tenderness on exam. Will continue to monitor during course of hospital stay.  - Will plan to discharge to home on post-op day 2 or 3.  - Patient is in agreement with this plan.      LOS: 4 days   45, Student-PA 04/26/2021, 9:00 AM

## 2021-04-27 MED ORDER — ACETAMINOPHEN 500 MG PO TABS: 1000.0000 mg | ORAL_TABLET | Freq: Four times a day (QID) | ORAL | 0 refills | Status: DC

## 2021-04-27 MED ORDER — FERROUS SULFATE 325 (65 FE) MG PO TABS: 325.0000 mg | ORAL_TABLET | ORAL | 3 refills | Status: AC

## 2021-04-27 MED ORDER — POLYETHYLENE GLYCOL 3350 17 G PO PACK: 17.0000 g | PACK | Freq: Every day | ORAL | 0 refills | Status: DC

## 2021-04-27 MED ORDER — IBUPROFEN 600 MG PO TABS: 600.0000 mg | ORAL_TABLET | Freq: Four times a day (QID) | ORAL | 0 refills | Status: DC

## 2021-04-27 MED ORDER — COCONUT OIL OIL: 1.0000 "application " | TOPICAL_OIL | 0 refills | Status: DC | PRN

## 2021-04-27 MED ORDER — FUROSEMIDE 40 MG PO TABS
40.0000 mg | ORAL_TABLET | Freq: Every day | ORAL | Status: DC
  Administered 2021-04-27 – 2021-04-28 (×2): 40 mg via ORAL
  Filled 2021-04-27 (×2): qty 1

## 2021-04-27 MED ORDER — OXYCODONE HCL 5 MG PO TABS: 5.0000 mg | ORAL_TABLET | ORAL | 0 refills | Status: DC | PRN

## 2021-04-27 NOTE — Lactation Note (Signed)
This note was copied from a baby's chart. Lactation Consultation Note Mom having trouble latching. Baby screaming, refusing the breast. Mom appears slightly awkward latching in cross cradle LC calmed baby. Noted when baby crying baby appears to have limited mobility of her tongue. Thin labial frenulum noted.  LC finally got baby suckling at the breast. Rt. Breast feels tight. Lt. Breast softer.  Mom has a lot of generalized edema. Hand expressed colostrum. Encouraged mom to massage breast occasionally during feedings. Discussed positioning and support during feedings. Mom wants to supplement. Mom stated baby has been BF for 3 hrs and screaming like she is hungry. Mom has agreed to give [facility]. Mom shown how to use DEBP & how to disassemble, clean, & reassemble parts. Mom knows to pump q3h for 15-20 min. Spoke w/RN about giving DBM.  Patient Name: Christine Ibarra [id number] Date: 04/27/2021 Reason for consult: Mother's request;Difficult latch;Term Age:46 hours  Maternal Data Has patient been taught Hand Expression?: Yes  Feeding Mother's Current Feeding Choice: Breast Milk and Donor Milk  LATCH Score Latch: Grasps breast easily, tongue down, lips flanged, rhythmical sucking.  Audible Swallowing: None  Type of Nipple: Everted at rest and after stimulation  Comfort (Breast/Nipple): Soft / non-tender  Hold (Positioning): Assistance needed to correctly position infant at breast and maintain latch.  LATCH Score: 7   Lactation Tools Discussed/Used Tools: Pump Breast pump type: Double-Electric Breast Pump Pump Education: Setup, frequency, and cleaning;Milk Storage Reason for Pumping: supplementation Pumping frequency: Q 3 hr  Interventions Interventions: Breast feeding basics reviewed;Support pillows;Assisted with latch;Position options;Skin to skin;Breast massage;Hand express;DEBP;Breast compression;Adjust position  Discharge Pump: Personal  Consult Status Consult Status:  Follow-up Date: 04/27/21 Follow-up type: In-patient    Charyl Dancer 04/27/2021, 12:17 AM

## 2021-04-27 NOTE — Lactation Note (Signed)
This note was copied from a baby's chart. Lactation Consultation Note  Patient Name: Christine Ibarra MBEML'J Date: 04/27/2021 Reason for consult: Follow-up assessment;Term;1st time breastfeeding;[place] Age:43 hours  Maternal Data Has patient been taught Hand Expression?: Yes Does the patient have breastfeeding experience prior to this delivery?: Yes How long did the patient breastfeed?: 10 months   P2 mother whose infant is now 47 hours old.  This is a term baby at 40+5 weeks.  Mother breast fed her first child (now 75 years old) for 10 months.  Mother requested latch assistance.  Baby frantic when I arrived.  Attempted to calm her by allowing her to suck on my gloved finger.  She continued to cry and was not able to obtain a suck on my gloved finger.  While she was crying I observed a thin lingual frenulum; discussed with mother.  Assisted to latch after a few attempts to the left breast in the football hold.  Educated mother on breast feeding basics while observing baby feed for 15 minutes.  Baby required gentle stimulation since she tires easily.  Upon baby releasing after 15 minutes, mother's nipple was well rounded with no signs of trauma.  Latched to the right breast and allowed mother time to continue breast feeding.  Mother asked to feed the 30 mls donor breast milk supplementation which I prepared immediately after breast feeding.  If she has any difficulty with this feeding mother will call RN.  She will then pump for 15 minutes after supplementation.  Praised mother for a good breast feeding session.  RN updated.   Feeding Mother's Current Feeding Choice: Breast Milk and Donor Milk Nipple Type: Extra Slow Flow  LATCH Score Latch: Grasps breast easily, tongue down, lips flanged, rhythmical sucking.  Audible Swallowing: Spontaneous and intermittent  Type of Nipple: Everted at rest and after stimulation  Comfort (Breast/Nipple): Filling, red/small blisters or bruises,  mild/mod discomfort  Hold (Positioning): Assistance needed to correctly position infant at breast and maintain latch.  LATCH Score: 8   Lactation Tools Discussed/Used Breast pump type: Double-Electric Breast Pump;Manual Pump Education:  (No review needed; encouraged pumping every three hours) Reason for Pumping: Breast stimulation for supplementation Pumping frequency: Every three hours  Interventions Interventions: Assisted with latch;Skin to skin;Breast feeding basics reviewed;Breast compression;Adjust position;DEBP;Hand pump;Position options;Support pillows;Education;Breast massage  Discharge Pump: DEBP;Manual;Personal  Consult Status Consult Status: Follow-up Date: 04/28/21 Follow-up type: In-patient    Dora Sims 04/27/2021, 2:48 PM

## 2021-04-27 NOTE — Discharge Instructions (Signed)

## 2021-04-27 NOTE — Lactation Note (Signed)
This note was copied from a baby's chart. Lactation Consultation Note  Patient Name: Christine Ibarra Date: 04/27/2021 Reason for consult: Follow-up assessment;Term Age:43 hours   P2 mother whose infant is now 57 hours old.  This is a term baby at 40+5 weeks.  Mother breast fed her first child (now 25 years old) for 10 months.  Mother was hoping to be discharged home today, however, baby had a choking episode this morning which caused a color change. She reported baby has a lot of fluid and choking.  Mother has started supplementing with donor breast milk due to baby not being satisfied with breast feeding.  Mother has been set up with the DEBP on night shift but has not been pumping consistently.  Mother appears to be hesitant to lay baby down in the bassinet.  She had propped a pillow under baby's head until the nurse explained this was not safe and baby has to be on her back for sleep.  Mother had just finished feeding 15 mls of donor breast milk when I arrived.  She has not attempted breast feeding.  Suggested mother increase supplementation volumes to 30+ mls at this time with good burping during feedings.  Offered to return at the next feeding for latch assistance as needed.  Discussed "supply and demand" and that mother may desire to have lactation assistance here in the hospital.  Encouraged her to pump for 15 minutes after every feeding attempt.    Reiterated the importance of back to sleep and praised mother for her prompt response to baby choking by holding her upright and using the bulb syringe.  RN updated and will call [facility] to provide patient's own bottle of donor breast milk to last another 24 hours.  Father not present due to her three year old feeling sick at home.  Mother will call for assistance.     Maternal Data Has patient been taught Hand Expression?: Yes Does the patient have breastfeeding experience prior to this delivery?: Yes How long did the patient  breastfeed?: 10 months  Feeding Mother's Current Feeding Choice: Breast Milk and Donor Milk Nipple Type: Extra Slow Flow  LATCH Score                    Lactation Tools Discussed/Used Breast pump type: Double-Electric Breast Pump;Manual Pump Education:  (No review needed; encouraged pumping every three hours) Reason for Pumping: Breast stimulation for supplementation Pumping frequency: Every three hours  Interventions Interventions: Breast feeding basics reviewed;Skin to skin  Discharge Pump: DEBP;Manual;Personal  Consult Status Consult Status: Follow-up Date: 04/28/21 Follow-up type: In-patient    Dora Sims 04/27/2021, 12:13 PM

## 2021-04-27 NOTE — Lactation Note (Signed)
This note was copied from a baby's chart. Lactation Consultation Note  Patient Name: Christine Ibarra CHENI'D Date: 04/27/2021 Reason for consult: Follow-up assessment;Mother's request Age:43 hours   Mom desired assistance with latch.  Mom is very tired and infant is cluster feeding. Mom has a consent signed for  [facility] provided by RN.     LC first assisted with latch and helped bring infant closer and deeper on the breast. A few swallows were heard and mom was encouraged to use breast massage and compression in order to encourage continuous sucking and swallowing.  Mom feels comfortable and denies pain when feeding.  Mom uses cradle hold and is able to latch infant.  Rolled blankets used for more head support for infant.   Mom had questions about breastfeeding during the day and give formula at night so infant would sleep.    LC discussed DEBP use if providing DBM to infant after BF.    Supplies left in room.  LC [address] to finish consult.  Report given.       Maternal Data Has patient been taught Hand Expression?: Yes  Feeding Mother's Current Feeding Choice: Breast Milk and Donor Milk  LATCH Score Latch: Grasps breast easily, tongue down, lips flanged, rhythmical sucking.  Audible Swallowing: A few with stimulation  Type of Nipple: Everted at rest and after stimulation  Comfort (Breast/Nipple): Soft / non-tender  Hold (Positioning): Assistance needed to correctly position infant at breast and maintain latch.  LATCH Score: 8   Lactation Tools Discussed/Used    Interventions Interventions: Breast feeding basics reviewed;Assisted with latch;Support pillows  Discharge Pump: Personal  Consult Status Consult Status: Follow-up Date: 04/28/21 Follow-up type: In-patient    Maryruth Hancock [facility] 04/27/2021, 12:00 AM

## 2021-04-28 NOTE — Discharge Summary (Signed)
Postpartum Discharge Summary     Patient Name: Christine Ibarra DOB: 12/11/1975 MRN: [id number]  Date of admission: 04/22/2021 Delivery date:04/25/2021  Delivering provider: Truett Mainland  Date of discharge: 04/28/2021  Admitting diagnosis: Previous cesarean delivery, antepartum [O34.219] AMA (advanced maternal age) multigravida 35+ [O09.529] Intrauterine pregnancy: [redacted]w[redacted]d     Secondary diagnosis:  Active Problems:   Late prenatal care affecting pregnancy in second trimester   Excessive weight gain during pregnancy in second trimester   AMA (advanced maternal age) multigravida 35+   Previous cesarean delivery, antepartum  Additional problems: none    Discharge diagnosis: Term Pregnancy Delivered                                              Post partum procedures:none Augmentation: Pitocin and IP Foley Complications: Intrauterine Inflammation or infection (Chorioamniotis)  Hospital course: Induction of Labor With Cesarean Section   45 y.o. yo G2P1001 at [redacted]w[redacted]d was admitted to the hospital 04/22/2021 for induction of labor. Patient had a labor course significant for chorio--given Amp and [place]. The patient went for cesarean section due to Arrest of Dilation. Delivery details are as follows: Membrane Rupture Time/Date: 1:30 PM ,04/23/2021   Delivery Method:C-Section, Low Transverse  Details of operation can be found in separate operative Note.  Patient had an uncomplicated postpartum course. She is ambulating, tolerating a regular diet, passing flatus, and urinating well.  Patient is discharged home in stable condition on 04/28/21.      Newborn Data: Birth date:04/25/2021  Birth time:9:19 AM  Gender:Female  Living status:Living  Apgars:9 ,9  Weight:3980 g                                  Magnesium Sulfate received: No BMZ received: No Rhophylac:N/A MMR:N/A T-DaP:declined prenatally    Flu: No Transfusion:No  Physical exam  Vitals:   04/27/21 0506 04/27/21 1304 04/28/21  0200 04/28/21 0525  BP: 103/74 112/68 113/74 99/69  Pulse: 72 91 79 75  Resp: $Remo'17 17 18 19  'gTUyo$ Temp: 97.7 F (36.5 C) 98.5 F (36.9 C) 98.1 F (36.7 C) 97.7 F (36.5 C)  TempSrc: Oral Oral Oral Oral  SpO2: 100% 100% 100% 99%  Weight:      Height:       General: alert, cooperative and no distress Lochia: appropriate Uterine Fundus: firm Incision: Healing well with no significant drainage DVT Evaluation: No evidence of DVT seen on physical exam. Labs: Lab Results  Component Value Date   WBC 16.9 (H) 04/26/2021   HGB 9.3 (L) 04/26/2021   HCT 27.5 (L) 04/26/2021   MCV 93.9 04/26/2021   PLT 165 04/26/2021   CMP Latest Ref Rng & Units 04/26/2021  Creatinine 0.44 - 1.00 mg/dL 0.77   Edinburgh Score: Edinburgh Postnatal Depression Scale Screening Tool 04/26/2021  I have been able to laugh and see the funny side of things. 0  I have looked forward with enjoyment to things. 0  I have blamed myself unnecessarily when things went wrong. 1  I have been anxious or worried for no good reason. 1  I have felt scared or panicky for no good reason. 2  Things have been getting on top of me. 0  I have been so unhappy that I have had difficulty sleeping. 0  I have felt sad or miserable. 0  I have been so unhappy that I have been crying. 1  The thought of harming myself has occurred to me. 0  Edinburgh Postnatal Depression Scale Total 5     After visit meds:  Allergies as of 04/28/2021   No Known Allergies     Medication List    TAKE these medications   acetaminophen 500 MG tablet Commonly known as: TYLENOL Take 2 tablets (1,000 mg total) by mouth every 6 (six) hours.   coconut oil Oil Apply 1 application topically as needed.   ferrous sulfate 324 (65 Fe) MG Tbec Take 1 tablet (325 mg total) by mouth every other day. What changed: Another medication with the same name was added. Make sure you understand how and when to take each.   ferrous sulfate 325 (65 FE) MG tablet Take 1 tablet  (325 mg total) by mouth every other day. What changed: You were already taking a medication with the same name, and this prescription was added. Make sure you understand how and when to take each.   ibuprofen 600 MG tablet Commonly known as: ADVIL Take 1 tablet (600 mg total) by mouth every 6 (six) hours.   multivitamin-prenatal 27-0.8 MG Tabs tablet Take 1 tablet by mouth in the morning.   oxyCODONE 5 MG immediate release tablet Commonly known as: Oxy IR/ROXICODONE Take 1-2 tablets (5-10 mg total) by mouth every 4 (four) hours as needed for moderate pain.   polyethylene glycol 17 g packet Commonly known as: MIRALAX / GLYCOLAX Take 17 g by mouth daily.            Discharge Care Instructions  (From admission, onward)         Start     Ordered   04/27/21 0000  Discharge wound care:       Comments: Remove dressing 7 days post-operatively.   04/27/21 0644           Discharge home in stable condition Infant Feeding: Breast Infant Disposition:home with mother Discharge instruction: per After Visit Summary and Postpartum booklet. Activity: Advance as tolerated. Pelvic rest for 6 weeks.  Diet: routine diet Future Appointments:No future appointments. Follow up Visit:   Please schedule this patient for a In person postpartum visit in 6 weeks with the following provider: Any provider. Additional Postpartum F/U:Incision check 1 week  High risk pregnancy complicated by: low lying placenta (now resolved), h/o CS for arrest of dilation, fetal PACs, AMA Delivery mode:  C-Section, Low Transverse  Anticipated Birth Control:  IUD   04/28/2021 Truett Mainland, DO

## 2021-04-28 NOTE — Lactation Note (Signed)
This note was copied from a baby's chart. Lactation Consultation Note  Patient Name: Christine Ibarra UXNAT'F Date: 04/28/2021 Reason for consult: Follow-up assessment Age:43 Hours Mother assist with properly positioning infant to the breast and use of good pillow support . Mother has infant on the tip of the nipple. Taught mother how to get infant latched well from the beginning of the latch. Infant opened wide and began a suck swallow pattern. Observed audible swallows. Infant sustained latch for 20 mins. Assist with latching infant to alternate breast. Infant sustined latch for 18 mins. Mother has trauma on the RT nipple from poor latch. Switched infant to football hold for mother comfortable latch.  Mother has comfort gels.  Mother has a [employer] DEBP at home.  Advised mother of the importance of post pumping ever 3 hours if supplementing infant . Mother reports that she plans to use formula and breastfeed when she gets home.  Discussed treatment and prevention of engorgement. Mother to continue to cue base feed infant and feed at least 8-12 times or more in 24 hours and advised to allow for cluster feeding infant as needed.  Mother to continue to due STS. Mother is aware of available LC services at [facility], BFS[facility]'S, OP Dept, and phone # for questions or concerns about breastfeeding.  Mother receptive to all teaching and plan of care.     Maternal Data    Feeding Mother's Current Feeding Choice: Breast Milk and Donor Milk  LATCH Score Latch: Grasps breast easily, tongue down, lips flanged, rhythmical sucking.  Audible Swallowing: Spontaneous and intermittent  Type of Nipple: Everted at rest and after stimulation  Comfort (Breast/Nipple): Filling, red/small blisters or bruises, mild/mod discomfort  Hold (Positioning): Assistance needed to correctly position infant at breast and maintain latch.  LATCH Score: 8   Lactation Tools Discussed/Used     Interventions Interventions: Comfort gels;Hand pump;Support pillows;Adjust position;Breast compression;Hand express;Skin to skin;Assisted with latch  Discharge Discharge Education: Engorgement and breast care;Warning signs for feeding baby;Outpatient recommendation Pump: Personal;Manual  Consult Status Consult Status: Complete    Michel Bickers 04/28/2021, 12:24 PM

## 2021-05-24 ENCOUNTER — Ambulatory Visit: Payer: Medicaid Other | Admitting: Legal Medicine

## 2021-06-07 ENCOUNTER — Other Ambulatory Visit: Payer: Self-pay

## 2021-06-07 ENCOUNTER — Encounter: Payer: Self-pay | Admitting: Family Medicine

## 2021-06-07 ENCOUNTER — Ambulatory Visit (INDEPENDENT_AMBULATORY_CARE_PROVIDER_SITE_OTHER): Payer: Medicaid Other | Admitting: Family Medicine

## 2021-06-07 DIAGNOSIS — Z98891 History of uterine scar from previous surgery: Secondary | ICD-10-CM

## 2021-06-07 NOTE — Progress Notes (Signed)
Post Partum Visit Note  Christine Ibarra is a 45 y.o. G22P2002 female who presents for a postpartum visit. She is 6 weeks postpartum following a repeat cesarean section.  I have fully reviewed the prenatal and intrapartum course. The delivery was at [redacted]w[redacted]d gestational weeks.  Anesthesia: spinal. Postpartum course has been unremarkable. Baby is doing well. Baby is feeding by breast. Bleeding no bleeding. Bowel function is  abnormal. Pt reports issues with constipation and hemorrhoids . Bladder function is normal. Patient is not sexually active. Contraception method is  discuss options . Postpartum depression screening: negative. EPDS: 4   The pregnancy intention screening data noted above was reviewed. Potential methods of contraception were discussed. The patient elected to proceed with No data recorded.   Edinburgh Postnatal Depression Scale - 06/07/21 1412       Edinburgh Postnatal Depression Scale:  In the Past 7 Days   I have been able to laugh and see the funny side of things. 0    I have looked forward with enjoyment to things. 0    I have blamed myself unnecessarily when things went wrong. 0    I have been anxious or worried for no good reason. 2    I have felt scared or panicky for no good reason. 0    Things have been getting on top of me. 0    I have been so unhappy that I have had difficulty sleeping. 2    I have felt sad or miserable. 0    I have been so unhappy that I have been crying. 0    The thought of harming myself has occurred to me. 0    Edinburgh Postnatal Depression Scale Total 4              The following portions of the patient's history were reviewed and updated as appropriate: allergies, current medications, past family history, past medical history, past social history, past surgical history, and problem list.  Review of Systems Pertinent items noted in HPI and remainder of comprehensive ROS otherwise negative.    Objective:  BP 108/75 (BP  Location: Right Arm, Patient Position: Sitting, Cuff Size: Normal)   Pulse 81   Ht 5\' 3"  (1.6 m)   Wt 156 lb (70.8 kg)   LMP 07/07/2020 (Approximate)   Breastfeeding Yes   BMI 27.63 kg/m    General:  alert, cooperative, and appears stated age  Lungs: Comfortable on room air   GU:   deferred        Assessment:    Normal postpartum exam. Pap smear not done at today's visit.   Plan:   Essential components of care per ACOG recommendations:  1.  Mood and well being: Patient with negative depression screening today. Reviewed local resources for support.  - Patient does not use tobacco.  - hx of drug use? No    2. Infant care and feeding:  -Patient currently breastmilk feeding? Yes Reviewed importance of draining breast regularly to support lactation. -Social determinants of health (SDOH) reviewed in EPIC. No concerns  3. Sexuality, contraception and birth spacing - Patient does not want a pregnancy in the next year.  Desired family size is 2 children.  - Reviewed forms of contraception in tiered fashion. Patient desired IUD, she prefers a female provider place it, she will return at a later time for this. - Discussed birth spacing of 18 months  4. Sleep and fatigue -Encouraged family/partner/community support of 4 hrs of uninterrupted  sleep to help with mood and fatigue  5. Physical Recovery  - Discussed patients delivery and complications - Patient had a repeat cesarean after arrest of dilation during TOLAC - Patient has urinary incontinence? No - Patient is safe to resume physical and sexual activity  6.  Health Maintenance Lab Results  Component Value Date   DIAGPAP  12/07/2020    - Negative for intraepithelial lesion or malignancy (NILM)   HPVHIGH Positive (A) 12/07/2020   - Repeat pap 11/2021 - Mammogram: N/a  7. Chronic Disease -Constipation: increase miralax frequency - PCP follow up  Venora Maples, MD [facility]

## 2021-06-30 ENCOUNTER — Encounter (HOSPITAL_COMMUNITY): Payer: Self-pay | Admitting: Family Medicine

## 2021-07-11 ENCOUNTER — Ambulatory Visit: Payer: Medicaid Other | Admitting: Advanced Practice Midwife

## 2021-07-11 NOTE — Progress Notes (Deleted)
   GYNECOLOGY OFFICE PROCEDURE NOTE  Christine Ibarra is a 45 y.o. [id number] here for Liletta IUD insertion. No GYN concerns.  Last pap smear was on *** and was normal.  IUD Insertion Procedure Note Patient identified, informed consent performed, consent signed.   Discussed risks of irregular bleeding, cramping, infection, malpositioning or misplacement of the IUD outside the uterus which may require further procedure such as laparoscopy. Time out was performed.  Urine pregnancy test negative.  Speculum placed in the vagina.  Cervix visualized.  Cleaned with Betadine x 2.  Grasped anteriorly with a single tooth tenaculum.  Uterus sounded to *** cm.  Liletta IUD placed per manufacturer's recommendations.  Strings trimmed to 3 cm. Tenaculum was removed, good hemostasis noted.  Patient tolerated procedure well.   Patient was given post-procedure instructions.  She was advised to have backup contraception for one week.  Patient was also asked to check IUD strings periodically and follow up in 4 weeks for IUD check.   Christine Ibarra, CNM 8:26 AM

## 2021-08-16 ENCOUNTER — Ambulatory Visit: Payer: Medicaid Other | Admitting: Women's Health

## 2021-09-01 ENCOUNTER — Encounter: Payer: Self-pay | Admitting: Radiology

## 2021-10-01 IMAGING — US US MFM OB LIMITED
1 series · 14 of 28 positions shown · non-contrast
Comparison: none

[Series 1: us mfm ob limited · 76 acquisitions, 14 frames shown]
[im 3/76]
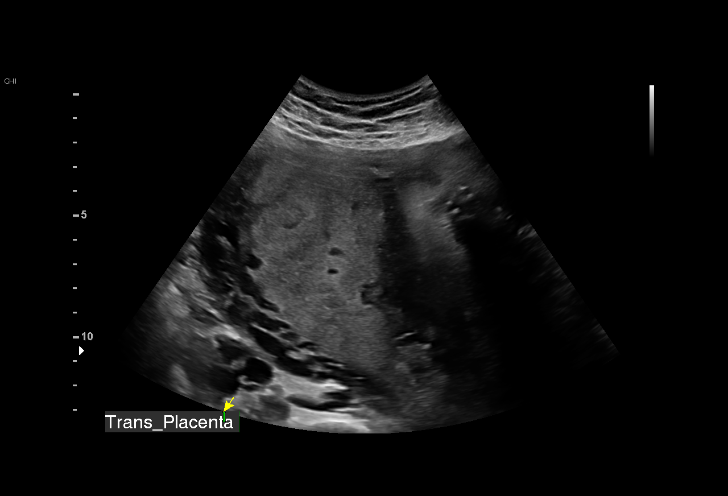
[im 9/76]
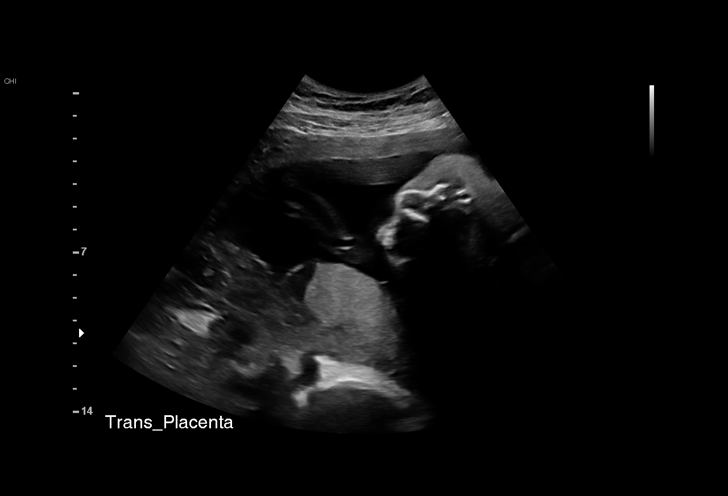
[im 14/76]
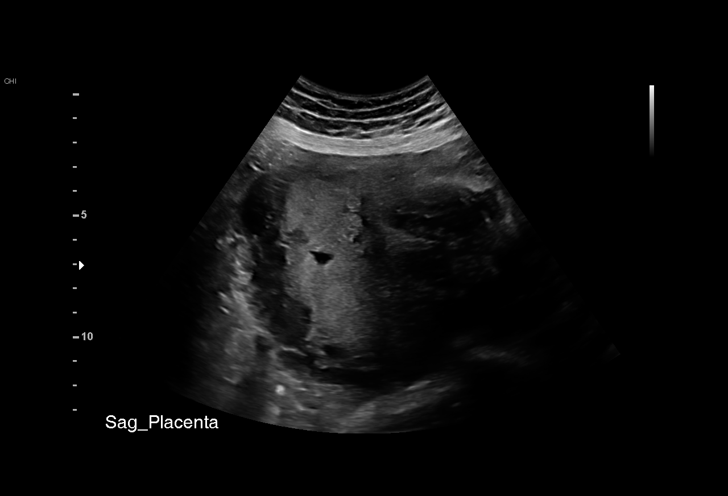
[im 20/76]
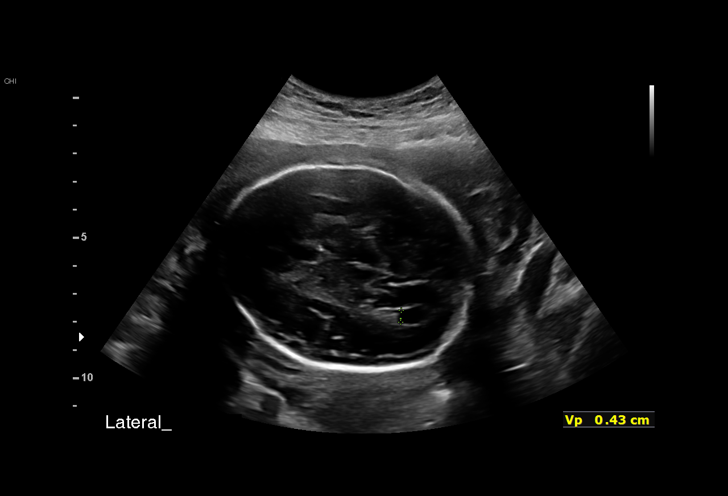
[im 26/76]
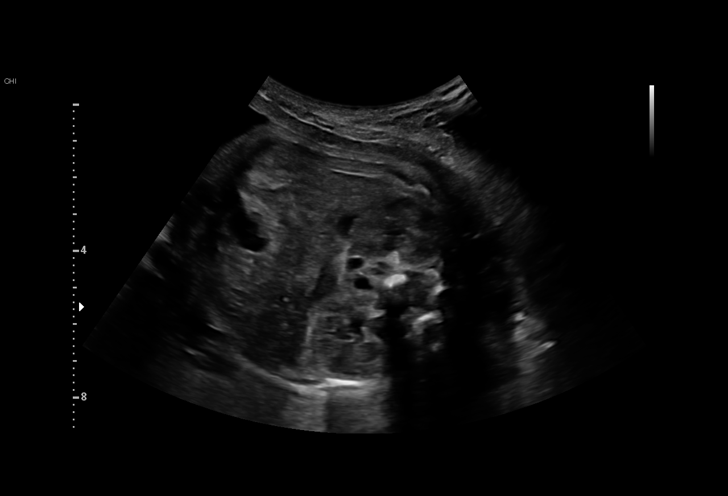
[im 31/76]
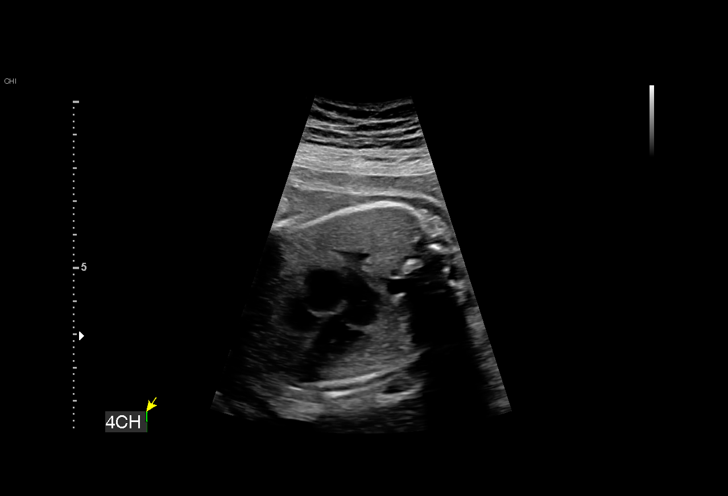
[im 37/76]
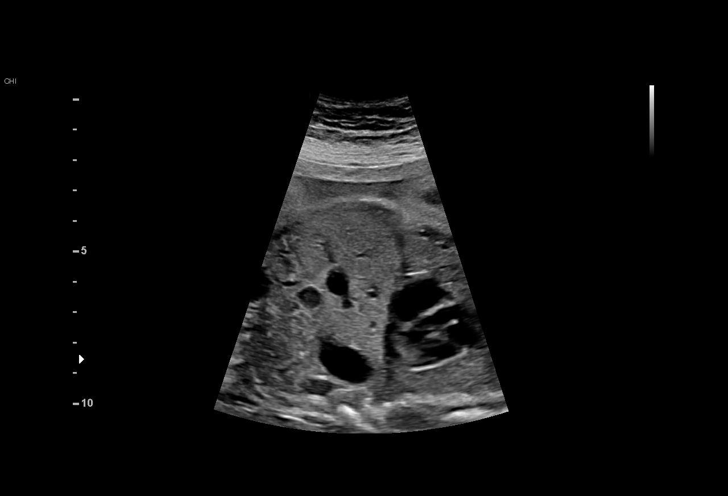
[im 42/76]
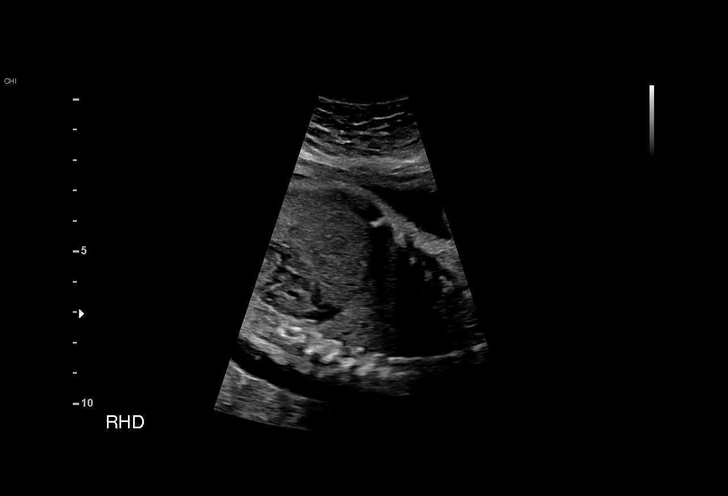
[im 48/76]
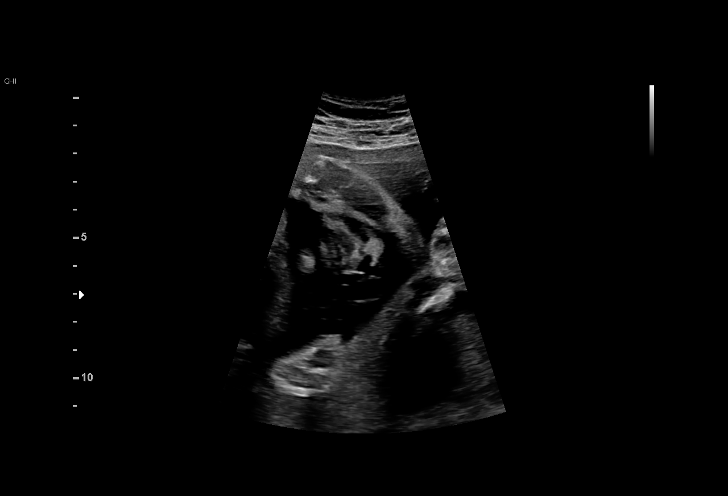
[im 53/76]
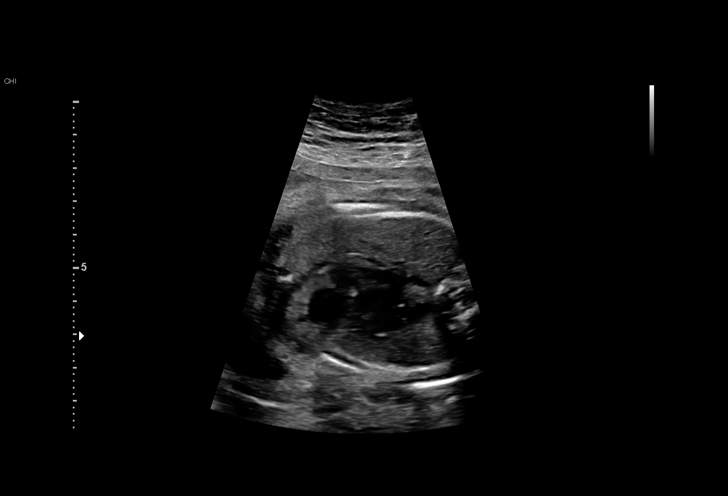
[im 59/76]
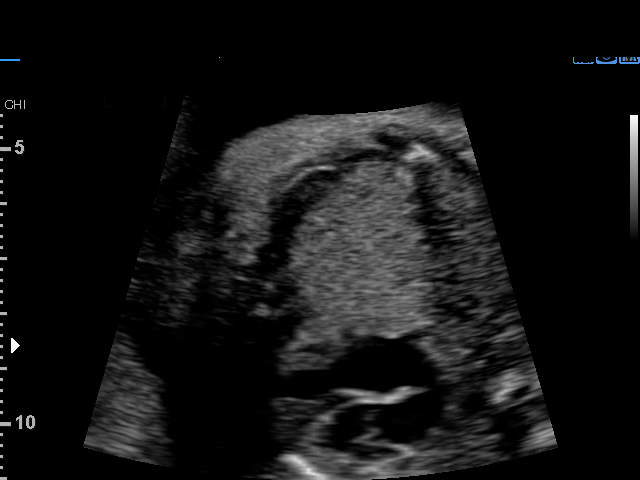
[im 64/76]
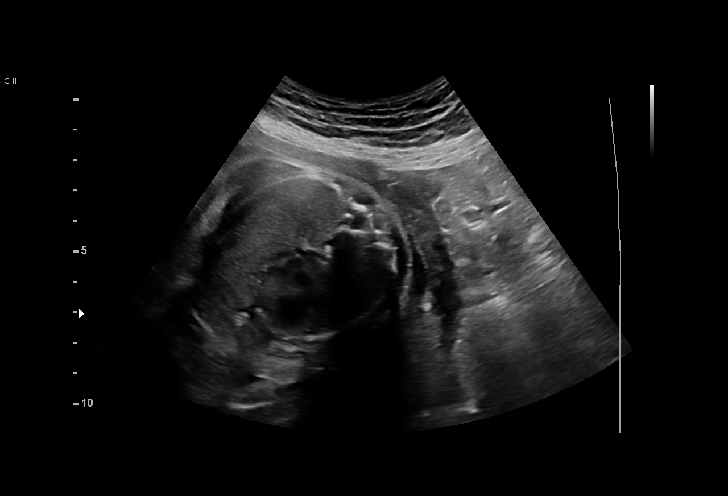
[im 70/76]
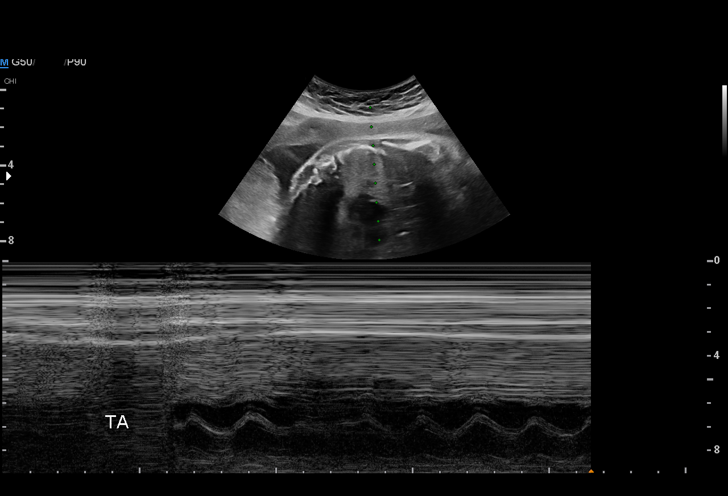
[im 76/76]
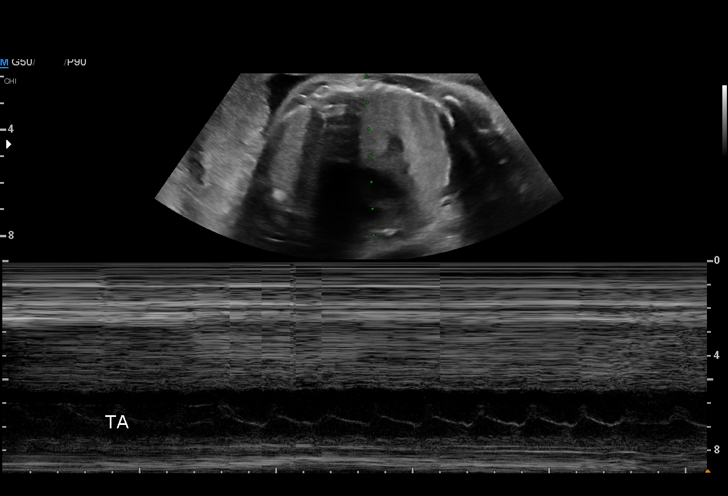

[14 of 28 positions shown; findings below may reference images not displayed]

[REDACTED]care

Indications

 Late prenatal care, second trimester
 History of cesarean delivery, currently
 pregnant
 Advanced maternal age multigravida 35+,
 second trimester
 27 weeks gestation of pregnancy
Fetal Evaluation

 Num Of Fetuses:         1
 Fetal Heart Rate(bpm):  155
 Cardiac Activity:       Observed
 Presentation:           Cephalic
 Placenta:               Posterior
 P. Cord Insertion:      Visualized, central

 Amniotic Fluid
 AFI FV:      Within normal limits

 AFI Sum(cm)     %Tile       Largest Pocket(cm)
 15.54           55

 RUQ(cm)       RLQ(cm)       LUQ(cm)        LLQ(cm)

Biometry
 LV:        4.3  mm
OB History

 Blood Type:   O+
 Gravidity:    2         Term:   1        Prem:   0        SAB:   0
 TOP:          0       Ectopic:  0        Living: 1
Gestational Age

 LMP:           28w 6d        Date:  07/07/20                 EDD:   04/13/21
 Best:          27w 6d     Det. By:  U/S  (12/14/20)          EDD:   04/20/21
Anatomy

 Cranium:               Previously seen        Aortic Arch:            Appears normal
 Cavum:                 Previously seen        Ductal Arch:            Previously seen
 Ventricles:            Appears normal         Diaphragm:              Appears normal
 Choroid Plexus:        Previously seen        Stomach:                Appears normal, left
                                                                       sided
 Cerebellum:            Previously seen        Abdomen:                Appears normal
 Posterior Fossa:       Previously seen        Abdominal Wall:         Previously seen
 Nuchal Fold:           Not applicable (>20    Cord Vessels:           Previously seen
                        wks GA)
 Face:                  Orbits and profile     Kidneys:                Appear normal
                        previously seen
 Lips:                  Appears normal         Bladder:                Appears normal
 Thoracic:              Appears normal         Spine:                  Previously seen
 Heart:                 Appears normal         Upper Extremities:      Previously seen
                        (4CH, axis, and
                        situs)
 RVOT:                  Previously seen        Lower Extremities:      Previously seen
 LVOT:                  Previously seen

 Other:  Fetus appears to be female. Nasal bone prev visualized. Heels/feet
         and open hands/5th digits prev visualized.
Cervix Uterus Adnexa

 Cervix
 Not visualized (advanced GA >66wks)
Impression

 Follow up growth due to prior premature atrial contractions.
 Normal interval growth with measurements consistent with
 dates
 Good fetal movement and amniotic fluid volume

 Premature atrial contractions were again seen today we
 observed irregularly irregular beats. The frequency was
 intermittent and 9every 5- 20 beats. There is not evidence of
 hydrops or SVT.
 I discussed todays findings with Ms. Leeton and reviewed a
 list of causes of PAC's she reported that she has taken cold
 medicine but just recently stopped.

 She is scheduled to return on [DATE] for follow up
